# Patient Record
Sex: Female | Born: 1990 | Race: Black or African American | Hispanic: No | Marital: Single | Smoking: Never smoker
Health system: Southern US, Community
[De-identification: ages and names within clinical notes are randomized; demographics above are authoritative.]

## PROBLEM LIST (undated history)

## (undated) DIAGNOSIS — J45909 Unspecified asthma, uncomplicated: Secondary | ICD-10-CM

## (undated) DIAGNOSIS — N809 Endometriosis, unspecified: Secondary | ICD-10-CM

## (undated) HISTORY — PX: WISDOM TOOTH EXTRACTION: SHX21

## (undated) HISTORY — PX: OTHER SURGICAL HISTORY: SHX169

## (undated) HISTORY — PX: ABDOMINAL SURGERY: SHX537

---

## 2017-07-09 ENCOUNTER — Encounter (HOSPITAL_COMMUNITY): Admission: EM | Disposition: A | Discharge status: Home / Self Care | Payer: Self-pay | Source: Home / Self Care

## 2017-07-09 ENCOUNTER — Encounter (HOSPITAL_COMMUNITY): Payer: Self-pay | Admitting: Emergency Medicine

## 2017-07-09 ENCOUNTER — Other Ambulatory Visit: Payer: Self-pay

## 2017-07-09 ENCOUNTER — Observation Stay (HOSPITAL_COMMUNITY): Payer: BLUE CROSS/BLUE SHIELD | Admitting: Anesthesiology

## 2017-07-09 ENCOUNTER — Emergency Department (HOSPITAL_COMMUNITY): Payer: BLUE CROSS/BLUE SHIELD

## 2017-07-09 ENCOUNTER — Inpatient Hospital Stay (HOSPITAL_COMMUNITY)
Admission: EM | Admit: 2017-07-09 | Discharge: 2017-07-13 | DRG: 329 | Disposition: A | Discharge status: Home / Self Care | Payer: BLUE CROSS/BLUE SHIELD | Attending: General Surgery | Admitting: General Surgery

## 2017-07-09 DIAGNOSIS — K55029 Acute infarction of small intestine, extent unspecified: Secondary | ICD-10-CM | POA: Diagnosis present

## 2017-07-09 DIAGNOSIS — Q430 Meckel's diverticulum (displaced) (hypertrophic): Secondary | ICD-10-CM

## 2017-07-09 DIAGNOSIS — Z79899 Other long term (current) drug therapy: Secondary | ICD-10-CM

## 2017-07-09 DIAGNOSIS — K420 Umbilical hernia with obstruction, without gangrene: Principal | ICD-10-CM | POA: Diagnosis present

## 2017-07-09 DIAGNOSIS — Z885 Allergy status to narcotic agent status: Secondary | ICD-10-CM

## 2017-07-09 DIAGNOSIS — K567 Ileus, unspecified: Secondary | ICD-10-CM | POA: Diagnosis not present

## 2017-07-09 DIAGNOSIS — K436 Other and unspecified ventral hernia with obstruction, without gangrene: Secondary | ICD-10-CM | POA: Diagnosis present

## 2017-07-09 DIAGNOSIS — Z793 Long term (current) use of hormonal contraceptives: Secondary | ICD-10-CM

## 2017-07-09 HISTORY — PX: UMBILICAL HERNIA REPAIR: SHX196

## 2017-07-09 HISTORY — DX: Endometriosis, unspecified: N80.9

## 2017-07-09 LAB — COMPREHENSIVE METABOLIC PANEL
ALK PHOS: 82 U/L (ref 38–126)
ALT: 18 U/L (ref 14–54)
AST: 20 U/L (ref 15–41)
Albumin: 3.8 g/dL (ref 3.5–5.0)
Anion gap: 11 (ref 5–15)
BUN: 11 mg/dL (ref 6–20)
CALCIUM: 9 mg/dL (ref 8.9–10.3)
CO2: 20 mmol/L — ABNORMAL LOW (ref 22–32)
CREATININE: 0.79 mg/dL (ref 0.44–1.00)
Chloride: 109 mmol/L (ref 101–111)
Glucose, Bld: 112 mg/dL — ABNORMAL HIGH (ref 65–99)
Potassium: 3.2 mmol/L — ABNORMAL LOW (ref 3.5–5.1)
Sodium: 140 mmol/L (ref 135–145)
Total Bilirubin: 0.3 mg/dL (ref 0.3–1.2)
Total Protein: 7.8 g/dL (ref 6.5–8.1)

## 2017-07-09 LAB — URINALYSIS, ROUTINE W REFLEX MICROSCOPIC
Bilirubin Urine: NEGATIVE
Glucose, UA: NEGATIVE mg/dL
HGB URINE DIPSTICK: NEGATIVE
Ketones, ur: NEGATIVE mg/dL
LEUKOCYTES UA: NEGATIVE
Nitrite: NEGATIVE
PROTEIN: NEGATIVE mg/dL
Specific Gravity, Urine: 1.015 (ref 1.005–1.030)
pH: 8 (ref 5.0–8.0)

## 2017-07-09 LAB — CBC
HCT: 38.3 % (ref 36.0–46.0)
Hemoglobin: 12.6 g/dL (ref 12.0–15.0)
MCH: 27.3 pg (ref 26.0–34.0)
MCHC: 32.9 g/dL (ref 30.0–36.0)
MCV: 83.1 fL (ref 78.0–100.0)
PLATELETS: 283 10*3/uL (ref 150–400)
RBC: 4.61 MIL/uL (ref 3.87–5.11)
RDW: 13.3 % (ref 11.5–15.5)
WBC: 5.8 10*3/uL (ref 4.0–10.5)

## 2017-07-09 LAB — I-STAT BETA HCG BLOOD, ED (MC, WL, AP ONLY): I-stat hCG, quantitative: <5 m[IU]/mL (ref <5)

## 2017-07-09 LAB — LIPASE, BLOOD: LIPASE: 26 U/L (ref 11–51)

## 2017-07-09 SURGERY — REPAIR, HERNIA, UMBILICAL, ADULT
Anesthesia: General | Site: Abdomen

## 2017-07-09 MED ORDER — CEFOTETAN DISODIUM-DEXTROSE 2-2.08 GM-%(50ML) IV SOLR
2.0000 g | INTRAVENOUS | Status: AC
  Administered 2017-07-09: 2 g via INTRAVENOUS

## 2017-07-09 MED ORDER — BUPIVACAINE HCL (PF) 0.25 % IJ SOLN
INTRAMUSCULAR | Status: AC
  Filled 2017-07-09: qty 30

## 2017-07-09 MED ORDER — ROCURONIUM BROMIDE 10 MG/ML (PF) SYRINGE
PREFILLED_SYRINGE | INTRAVENOUS | Status: AC
  Filled 2017-07-09: qty 5

## 2017-07-09 MED ORDER — ONDANSETRON HCL 4 MG/2ML IJ SOLN
INTRAMUSCULAR | Status: DC | PRN
  Administered 2017-07-09: 4 mg via INTRAVENOUS

## 2017-07-09 MED ORDER — SIMETHICONE 80 MG PO CHEW
40.0000 mg | CHEWABLE_TABLET | Freq: Four times a day (QID) | ORAL | Status: DC | PRN
  Filled 2017-07-09: qty 1

## 2017-07-09 MED ORDER — MIDAZOLAM HCL 2 MG/2ML IJ SOLN
INTRAMUSCULAR | Status: AC
  Filled 2017-07-09: qty 2

## 2017-07-09 MED ORDER — DEXAMETHASONE SODIUM PHOSPHATE 10 MG/ML IJ SOLN
INTRAMUSCULAR | Status: AC
  Filled 2017-07-09: qty 1

## 2017-07-09 MED ORDER — METOPROLOL TARTRATE 5 MG/5ML IV SOLN: 5.0000 mg | Freq: Four times a day (QID) | INTRAVENOUS | Status: DC | PRN

## 2017-07-09 MED ORDER — ONDANSETRON 4 MG PO TBDP
4.0000 mg | ORAL_TABLET | Freq: Four times a day (QID) | ORAL | Status: DC | PRN
  Administered 2017-07-11: 4 mg via ORAL
  Filled 2017-07-09: qty 1

## 2017-07-09 MED ORDER — FENTANYL CITRATE (PF) 100 MCG/2ML IJ SOLN
25.0000 ug | INTRAMUSCULAR | Status: DC | PRN
  Administered 2017-07-09 (×2): 50 ug via INTRAVENOUS

## 2017-07-09 MED ORDER — HYDROMORPHONE HCL 1 MG/ML IJ SOLN
0.5000 mg | Freq: Once | INTRAMUSCULAR | Status: AC
  Administered 2017-07-09: 0.5 mg via INTRAVENOUS
  Filled 2017-07-09: qty 1

## 2017-07-09 MED ORDER — OXYCODONE HCL 5 MG PO TABS
5.0000 mg | ORAL_TABLET | ORAL | Status: DC | PRN
  Administered 2017-07-09 – 2017-07-10 (×6): 10 mg via ORAL
  Administered 2017-07-11 – 2017-07-12 (×3): 5 mg via ORAL
  Filled 2017-07-09 (×2): qty 1
  Filled 2017-07-09 (×4): qty 2
  Filled 2017-07-09: qty 1
  Filled 2017-07-09 (×2): qty 2

## 2017-07-09 MED ORDER — FENTANYL CITRATE (PF) 100 MCG/2ML IJ SOLN
INTRAMUSCULAR | Status: AC
  Filled 2017-07-09: qty 2

## 2017-07-09 MED ORDER — ROCURONIUM BROMIDE 10 MG/ML (PF) SYRINGE
PREFILLED_SYRINGE | INTRAVENOUS | Status: DC | PRN
  Administered 2017-07-09: 70 mg via INTRAVENOUS

## 2017-07-09 MED ORDER — SUGAMMADEX SODIUM 200 MG/2ML IV SOLN
INTRAVENOUS | Status: DC | PRN
  Administered 2017-07-09: 200 mg via INTRAVENOUS

## 2017-07-09 MED ORDER — MIDAZOLAM HCL 5 MG/5ML IJ SOLN
INTRAMUSCULAR | Status: DC | PRN
  Administered 2017-07-09: 1 mg via INTRAVENOUS

## 2017-07-09 MED ORDER — BUPIVACAINE HCL (PF) 0.25 % IJ SOLN
INTRAMUSCULAR | Status: DC | PRN
  Administered 2017-07-09: 20 mL

## 2017-07-09 MED ORDER — SUGAMMADEX SODIUM 200 MG/2ML IV SOLN
INTRAVENOUS | Status: AC
  Filled 2017-07-09: qty 2

## 2017-07-09 MED ORDER — PROPOFOL 10 MG/ML IV BOLUS
INTRAVENOUS | Status: DC | PRN
  Administered 2017-07-09: 200 mg via INTRAVENOUS

## 2017-07-09 MED ORDER — LIDOCAINE 2% (20 MG/ML) 5 ML SYRINGE
INTRAMUSCULAR | Status: DC | PRN
  Administered 2017-07-09: 100 mg via INTRAVENOUS

## 2017-07-09 MED ORDER — ONDANSETRON HCL 4 MG/2ML IJ SOLN: 4.0000 mg | Freq: Once | INTRAMUSCULAR | Status: DC | PRN

## 2017-07-09 MED ORDER — PANTOPRAZOLE SODIUM 40 MG IV SOLR
40.0000 mg | Freq: Every day | INTRAVENOUS | Status: DC
  Administered 2017-07-09 – 2017-07-12 (×4): 40 mg via INTRAVENOUS
  Filled 2017-07-09 (×4): qty 40

## 2017-07-09 MED ORDER — BUPIVACAINE LIPOSOME 1.3 % IJ SUSP
20.0000 mL | Freq: Once | INTRAMUSCULAR | Status: AC
  Administered 2017-07-09: 20 mL
  Filled 2017-07-09: qty 20

## 2017-07-09 MED ORDER — DEXAMETHASONE SODIUM PHOSPHATE 10 MG/ML IJ SOLN
INTRAMUSCULAR | Status: DC | PRN
  Administered 2017-07-09: 10 mg via INTRAVENOUS

## 2017-07-09 MED ORDER — ONDANSETRON HCL 4 MG/2ML IJ SOLN
4.0000 mg | Freq: Once | INTRAMUSCULAR | Status: AC
  Administered 2017-07-09: 4 mg via INTRAVENOUS
  Filled 2017-07-09: qty 2

## 2017-07-09 MED ORDER — LORAZEPAM 2 MG/ML IJ SOLN
0.5000 mg | Freq: Once | INTRAMUSCULAR | Status: AC
Start: 2017-07-09 — End: 2017-07-09
  Administered 2017-07-09: 0.5 mg via INTRAVENOUS
  Filled 2017-07-09: qty 1

## 2017-07-09 MED ORDER — DIPHENHYDRAMINE HCL 25 MG PO CAPS: 25.0000 mg | ORAL_CAPSULE | Freq: Four times a day (QID) | ORAL | Status: DC | PRN

## 2017-07-09 MED ORDER — FENTANYL CITRATE (PF) 250 MCG/5ML IJ SOLN
INTRAMUSCULAR | Status: AC
  Filled 2017-07-09: qty 5

## 2017-07-09 MED ORDER — ESMOLOL HCL 100 MG/10ML IV SOLN
INTRAVENOUS | Status: DC | PRN
Start: 2017-07-09 — End: 2017-07-09
  Administered 2017-07-09: 30 mg via INTRAVENOUS

## 2017-07-09 MED ORDER — HYDROMORPHONE HCL 1 MG/ML IJ SOLN
0.5000 mg | INTRAMUSCULAR | Status: DC | PRN
  Administered 2017-07-09 – 2017-07-11 (×2): 1 mg via INTRAVENOUS
  Filled 2017-07-09 (×2): qty 1

## 2017-07-09 MED ORDER — MORPHINE SULFATE (PF) 4 MG/ML IV SOLN
4.0000 mg | Freq: Once | INTRAVENOUS | Status: AC
  Administered 2017-07-09: 4 mg via INTRAVENOUS
  Filled 2017-07-09: qty 1

## 2017-07-09 MED ORDER — ESMOLOL HCL 100 MG/10ML IV SOLN
INTRAVENOUS | Status: AC
  Filled 2017-07-09: qty 10

## 2017-07-09 MED ORDER — 0.9 % SODIUM CHLORIDE (POUR BTL) OPTIME
TOPICAL | Status: DC | PRN
  Administered 2017-07-09: 1000 mL

## 2017-07-09 MED ORDER — LACTATED RINGERS IV SOLN
INTRAVENOUS | Status: DC | PRN
  Administered 2017-07-09 (×2): via INTRAVENOUS

## 2017-07-09 MED ORDER — IOPAMIDOL (ISOVUE-300) INJECTION 61%
100.0000 mL | Freq: Once | INTRAVENOUS | Status: AC | PRN
  Administered 2017-07-09: 100 mL via INTRAVENOUS

## 2017-07-09 MED ORDER — FENTANYL CITRATE (PF) 100 MCG/2ML IJ SOLN
INTRAMUSCULAR | Status: DC | PRN
  Administered 2017-07-09 (×4): 50 ug via INTRAVENOUS
  Administered 2017-07-09: 100 ug via INTRAVENOUS
  Administered 2017-07-09: 50 ug via INTRAVENOUS
  Administered 2017-07-09: 100 ug via INTRAVENOUS
  Administered 2017-07-09: 50 ug via INTRAVENOUS

## 2017-07-09 MED ORDER — ONDANSETRON HCL 4 MG/2ML IJ SOLN
INTRAMUSCULAR | Status: AC
  Filled 2017-07-09: qty 2

## 2017-07-09 MED ORDER — FENTANYL CITRATE (PF) 250 MCG/5ML IJ SOLN
INTRAMUSCULAR | Status: AC
Start: 2017-07-09 — End: 2017-07-09
  Filled 2017-07-09: qty 5

## 2017-07-09 MED ORDER — LIDOCAINE 2% (20 MG/ML) 5 ML SYRINGE
INTRAMUSCULAR | Status: AC
  Filled 2017-07-09: qty 5

## 2017-07-09 MED ORDER — HYDRALAZINE HCL 20 MG/ML IJ SOLN: 10.0000 mg | INTRAMUSCULAR | Status: DC | PRN

## 2017-07-09 MED ORDER — DIPHENHYDRAMINE HCL 50 MG/ML IJ SOLN: 25.0000 mg | Freq: Four times a day (QID) | INTRAMUSCULAR | Status: DC | PRN

## 2017-07-09 MED ORDER — CEFOTETAN DISODIUM-DEXTROSE 2-2.08 GM-%(50ML) IV SOLR
INTRAVENOUS | Status: AC
  Filled 2017-07-09: qty 50

## 2017-07-09 MED ORDER — ONDANSETRON HCL 4 MG/2ML IJ SOLN
4.0000 mg | Freq: Four times a day (QID) | INTRAMUSCULAR | Status: DC | PRN
  Administered 2017-07-09 – 2017-07-10 (×2): 4 mg via INTRAVENOUS
  Filled 2017-07-09 (×2): qty 2

## 2017-07-09 MED ORDER — METHOCARBAMOL 1000 MG/10ML IJ SOLN
500.0000 mg | Freq: Three times a day (TID) | INTRAVENOUS | Status: DC | PRN
  Administered 2017-07-09 – 2017-07-11 (×2): 500 mg via INTRAVENOUS
  Filled 2017-07-09 (×2): qty 550

## 2017-07-09 MED ORDER — ACETAMINOPHEN 325 MG PO TABS
650.0000 mg | ORAL_TABLET | Freq: Four times a day (QID) | ORAL | Status: DC | PRN
  Administered 2017-07-12 – 2017-07-13 (×2): 650 mg via ORAL
  Filled 2017-07-09 (×2): qty 2

## 2017-07-09 MED ORDER — ACETAMINOPHEN 650 MG RE SUPP: 650.0000 mg | Freq: Four times a day (QID) | RECTAL | Status: DC | PRN

## 2017-07-09 MED ORDER — IOPAMIDOL (ISOVUE-300) INJECTION 61%
INTRAVENOUS | Status: AC
  Filled 2017-07-09: qty 100

## 2017-07-09 MED ORDER — POTASSIUM CHLORIDE IN NACL 20-0.45 MEQ/L-% IV SOLN
INTRAVENOUS | Status: DC
  Administered 2017-07-09 – 2017-07-13 (×6): via INTRAVENOUS
  Filled 2017-07-09 (×6): qty 1000

## 2017-07-09 MED ORDER — PROPOFOL 10 MG/ML IV BOLUS
INTRAVENOUS | Status: AC
  Filled 2017-07-09: qty 20

## 2017-07-09 MED ORDER — PROMETHAZINE HCL 25 MG/ML IJ SOLN: 6.2500 mg | INTRAMUSCULAR | Status: DC | PRN

## 2017-07-09 MED ORDER — DOCUSATE SODIUM 100 MG PO CAPS
100.0000 mg | ORAL_CAPSULE | Freq: Two times a day (BID) | ORAL | Status: DC
  Administered 2017-07-10 – 2017-07-13 (×7): 100 mg via ORAL
  Filled 2017-07-09 (×7): qty 1

## 2017-07-09 SURGICAL SUPPLY — 53 items
BENZOIN TINCTURE PRP APPL 2/3 (GAUZE/BANDAGES/DRESSINGS) ×4 IMPLANT
BLADE HEX COATED 2.75 (ELECTRODE) ×4 IMPLANT
BLADE SURG 15 STRL LF DISP TIS (BLADE) ×2 IMPLANT
BLADE SURG 15 STRL SS (BLADE) ×2
CELLS DAT CNTRL 66122 CELL SVR (MISCELLANEOUS) ×2 IMPLANT
CLOSURE WOUND 1/2 X4 (GAUZE/BANDAGES/DRESSINGS) ×1
COVER SURGICAL LIGHT HANDLE (MISCELLANEOUS) ×4 IMPLANT
DECANTER SPIKE VIAL GLASS SM (MISCELLANEOUS) ×4 IMPLANT
DERMABOND ADVANCED (GAUZE/BANDAGES/DRESSINGS)
DERMABOND ADVANCED .7 DNX12 (GAUZE/BANDAGES/DRESSINGS) IMPLANT
DRAIN PENROSE 18X1/2 LTX STRL (DRAIN) ×4 IMPLANT
DRAPE LAPAROSCOPIC ABDOMINAL (DRAPES) ×4 IMPLANT
DRSG OPSITE POSTOP 4X6 (GAUZE/BANDAGES/DRESSINGS) ×4 IMPLANT
ELECT PENCIL ROCKER SW 15FT (MISCELLANEOUS) ×4 IMPLANT
ELECT REM PT RETURN 15FT ADLT (MISCELLANEOUS) ×4 IMPLANT
GAUZE SPONGE 4X4 12PLY STRL (GAUZE/BANDAGES/DRESSINGS) ×4 IMPLANT
GLOVE BIO SURGEON STRL SZ 6 (GLOVE) ×4 IMPLANT
GLOVE BIO SURGEON STRL SZ7 (GLOVE) ×8 IMPLANT
GLOVE BIOGEL PI IND STRL 6.5 (GLOVE) ×2 IMPLANT
GLOVE BIOGEL PI IND STRL 7.0 (GLOVE) ×2 IMPLANT
GLOVE BIOGEL PI INDICATOR 6.5 (GLOVE) ×2
GLOVE BIOGEL PI INDICATOR 7.0 (GLOVE) ×2
GLOVE INDICATOR 6.5 STRL GRN (GLOVE) ×4 IMPLANT
GOWN STRL REUS W/ TWL XL LVL3 (GOWN DISPOSABLE) ×6 IMPLANT
GOWN STRL REUS W/TWL 2XL LVL3 (GOWN DISPOSABLE) ×4 IMPLANT
GOWN STRL REUS W/TWL XL LVL3 (GOWN DISPOSABLE) ×6
HANDLE SUCTION POOLE (INSTRUMENTS) ×2 IMPLANT
KIT BASIN OR (CUSTOM PROCEDURE TRAY) ×4 IMPLANT
LIGASURE IMPACT 36 18CM CVD LR (INSTRUMENTS) ×4 IMPLANT
NEEDLE HYPO 25X1 1.5 SAFETY (NEEDLE) ×4 IMPLANT
PACK BASIC VI WITH GOWN DISP (CUSTOM PROCEDURE TRAY) ×4 IMPLANT
RELOAD PROXIMATE 75MM BLUE (ENDOMECHANICALS) ×12 IMPLANT
RTRCTR WOUND ALEXIS 18CM MED (MISCELLANEOUS) ×4
SPONGE LAP 18X18 X RAY DECT (DISPOSABLE) ×8 IMPLANT
STAPLER GUN LINEAR PROX 60 (STAPLE) ×4 IMPLANT
STAPLER VISISTAT 35W (STAPLE) ×4 IMPLANT
STRIP CLOSURE SKIN 1/2X4 (GAUZE/BANDAGES/DRESSINGS) ×3 IMPLANT
SUCTION POOLE HANDLE (INSTRUMENTS) ×4
SUT MNCRL AB 4-0 PS2 18 (SUTURE) ×4 IMPLANT
SUT PDS AB 1 TP1 96 (SUTURE) ×8 IMPLANT
SUT PROLENE 2 0 SH DA (SUTURE) ×16 IMPLANT
SUT SILK 2 0 SH (SUTURE) IMPLANT
SUT SILK 3 0 (SUTURE)
SUT SILK 3-0 18XBRD TIE 12 (SUTURE) IMPLANT
SUT VIC AB 2-0 SH 27 (SUTURE) ×6
SUT VIC AB 2-0 SH 27X BRD (SUTURE) ×6 IMPLANT
SUT VIC AB 3-0 SH 18 (SUTURE) ×4 IMPLANT
SUT VIC AB 3-0 SH 27 (SUTURE) ×4
SUT VIC AB 3-0 SH 27XBRD (SUTURE) ×4 IMPLANT
SYR BULB IRRIGATION 50ML (SYRINGE) ×4 IMPLANT
SYR CONTROL 10ML LL (SYRINGE) ×4 IMPLANT
TOWEL OR 17X26 10 PK STRL BLUE (TOWEL DISPOSABLE) ×4 IMPLANT
YANKAUER SUCT BULB TIP 10FT TU (MISCELLANEOUS) ×4 IMPLANT

## 2017-07-09 NOTE — ED Notes (Signed)
Bed: [id number] Expected date:  Expected time:  Means of arrival:  Comments: EMS-umbilical hernia

## 2017-07-09 NOTE — Progress Notes (Signed)
Patient is transferred from surgery at 1825. Alert and  Oriented x4, vital signs was taken. Dressing is intact and small drainage. Will monitor patient as protocol.

## 2017-07-09 NOTE — H&P (Signed)
[facility] Admission Note  Kahliyah Dick 03-Apr-1991  [id number].    Requesting MD: Quintella Reichert Chief Complaint/Reason for Consult: umbilical hernia   HPI:  Leslie Stein is a 27yo female who presented to the [facility] earlier today with acute onset umbilical pain. States that she had an umbilical cyst removed in the OR in [place] around 2014 as well as a laparoscopic ovarian cyst removal, and has had an umbilical hernia present for a few years. States that it will pop in/out about twice weekly but this is the first time she was unable to reduce it. States that she vomited once this morning due to the pain. She is passing flatus. Pain became constant and severe so she came to the ED. WBC 5.8, afebrile. CT scan shows umbilical abdominal wall hernia containing a small bowel loop with the small bowel immediately deep to the umbilical hernia site, it is mildly distended and has markedly thickened walls with surrounding mesenteric inflammation, highly suspicious for ischemic bowel. Concern for incarceration and/or closed-loop obstruction at the hernia site. General surgery asked to see.  No significant PMH Anticoagulants: none Nonsmoker Employment: HR  ROS: Review of Systems  Constitutional: Negative.   HENT: Negative.   Eyes: Negative.   Respiratory: Negative.   Cardiovascular: Negative.   Gastrointestinal: Positive for abdominal pain, nausea and vomiting. Negative for constipation and diarrhea.  Genitourinary: Negative.   Musculoskeletal: Negative.   Skin: Negative.   Neurological: Negative.    All systems reviewed and otherwise negative except for as above  No family history on file.  Past Medical History:  Diagnosis Date  . Endometriosis     Past Surgical History:  Procedure Laterality Date  . ABDOMINAL SURGERY    . cyst on umbilicus      Social History:  does not have a smoking history on file. she has never used smokeless tobacco. She reports that  she drinks alcohol. She reports that she does not use drugs.  Allergies:  Allergies  Allergen Reactions  . Percocet [Oxycodone-Acetaminophen] Nausea And Vomiting     (Not in a hospital admission)  Prior to Admission medications   Not on File    Blood pressure (!) 161/90, pulse 78, temperature 97.6 F (36.4 C), temperature source Oral, resp. rate 19, height 5' 7"  (1.702 m), weight 240 lb (108.9 kg), last menstrual period 06/25/2017, SpO2 100 %. Physical Exam: General: pleasant, WD/WN AA female who is laying in bed in NAD HEENT: head is normocephalic, atraumatic.  Sclera are noninjected.  Pupils equal and round.  Ears and nose without any masses or lesions.  Mouth is pink and moist. Dentition fair Heart: regular, rate, and rhythm.  No obvious murmurs, gallops, or rubs noted.  Palpable pedal pulses bilaterally Lungs: CTAB, no wheezes, rhonchi, or rales noted.  Respiratory effort nonlabored Abd: soft, ND, +BS. no organomegaly. Incarcerated umbilical hernia TTP without overlying skin changes, not reducible. No other abdominal TTP MS: all 4 extremities are symmetrical with no cyanosis, clubbing, or edema. Skin: warm and dry with no masses, lesions, or rashes Psych: A&Ox3 with an appropriate affect. Neuro: cranial nerves grossly intact, extremity CSM intact bilaterally, normal speech  Results for orders placed or performed during the hospital encounter of 07/09/17 (from the past 48 hour(s))  I-Stat beta hCG blood, ED     Status: None   Collection Time: 07/09/17  9:24 AM  Result Value Ref Range   I-stat hCG, quantitative <5.0 <5 mIU/mL   Comment 3  Comment:   GEST. AGE      CONC.  (mIU/mL)   <=1 WEEK        5 - 50     2 WEEKS       50 - 500     3 WEEKS       100 - 10,000     4 WEEKS     1,000 - 30,000        FEMALE AND NON-PREGNANT FEMALE:     LESS THAN 5 mIU/mL   Lipase, blood     Status: None   Collection Time: 07/09/17  9:26 AM  Result Value Ref Range   Lipase 26 11 -  51 U/L    Comment: Performed at [facility], [place] [address], [place], [place] [address]  Comprehensive metabolic panel     Status: Abnormal   Collection Time: 07/09/17  9:26 AM  Result Value Ref Range   Sodium 140 135 - 145 mmol/L   Potassium 3.2 (L) 3.5 - 5.1 mmol/L   Chloride 109 101 - 111 mmol/L   CO2 20 (L) 22 - 32 mmol/L   Glucose, Bld 112 (H) 65 - 99 mg/dL   BUN 11 6 - 20 mg/dL   Creatinine, Ser 0.79 0.44 - 1.00 mg/dL   Calcium 9.0 8.9 - 10.3 mg/dL   Total Protein 7.8 6.5 - 8.1 g/dL   Albumin 3.8 3.5 - 5.0 g/dL   AST 20 15 - 41 U/L   ALT 18 14 - 54 U/L   Alkaline Phosphatase 82 38 - 126 U/L   Total Bilirubin 0.3 0.3 - 1.2 mg/dL   GFR calc non Af Amer >60 >60 mL/min   GFR calc Af Amer >60 >60 mL/min    Comment: (NOTE) The eGFR has been calculated using the CKD EPI equation. This calculation has not been validated in all clinical situations. eGFR's persistently <60 mL/min signify possible Chronic Kidney Disease.    Anion gap 11 5 - 15    Comment: Performed at [facility], [place] [address], [place], [place] [address]  CBC     Status: None   Collection Time: 07/09/17  9:26 AM  Result Value Ref Range   WBC 5.8 4.0 - 10.5 K/uL   RBC 4.61 3.87 - 5.11 MIL/uL   Hemoglobin 12.6 12.0 - 15.0 g/dL   HCT 38.3 36.0 - 46.0 %   MCV 83.1 78.0 - 100.0 fL   MCH 27.3 26.0 - 34.0 pg   MCHC 32.9 30.0 - 36.0 g/dL   RDW 13.3 11.5 - 15.5 %   Platelets 283 150 - 400 K/uL    Comment: Performed at [facility], [place] [address], [place], [place] [address]  Urinalysis, Routine w reflex microscopic     Status: Abnormal   Collection Time: 07/09/17  9:26 AM  Result Value Ref Range   Color, Urine YELLOW YELLOW   APPearance CLOUDY (A) CLEAR   Specific Gravity, Urine 1.015 1.005 - 1.030   pH 8.0 5.0 - 8.0   Glucose, UA NEGATIVE NEGATIVE mg/dL   Hgb urine dipstick NEGATIVE NEGATIVE   Bilirubin Urine NEGATIVE NEGATIVE   Ketones, ur NEGATIVE  NEGATIVE mg/dL   Protein, ur NEGATIVE NEGATIVE mg/dL   Nitrite NEGATIVE NEGATIVE   Leukocytes, UA NEGATIVE NEGATIVE    Comment: Performed at [place] [address], [place], [place] [address]   No results found.    Assessment/Plan Incarcerated umbilical hernia - Concern  for ischemic bowel on CT scan. Admit to med-surg. Keep patient NPO and continue IVF. Will take her to the operating room urgently today for umbilical hernia repair and possible bowel resection.  ID - cefotetan on call to OR VTE - SCDs FEN - IVF, NPO Foley - none Follow up - TBD  Wellington Hampshire, [facility] 07/09/2017, 11:25 AM Pager: [phone] Consults: [phone] Mon-Fri 7:00 am-4:30 pm Sat-Sun 7:00 am-11:30 am

## 2017-07-09 NOTE — Anesthesia Procedure Notes (Signed)
Procedure Name: Intubation Date/Time: 07/09/2017 3:18 PM Performed by: Raenette Rover, CRNA Pre-anesthesia Checklist: Patient identified, Emergency Drugs available, Suction available and Patient being monitored Patient Re-evaluated:Patient Re-evaluated prior to induction Oxygen Delivery Method: Circle system utilized Preoxygenation: Pre-oxygenation with 100% oxygen Induction Type: IV induction and Rapid sequence Laryngoscope Size: Mac and 3 Grade View: Grade I Tube type: Oral Tube size: 7.0 mm Number of attempts: 1 Airway Equipment and Method: Stylet Placement Confirmation: ETT inserted through vocal cords under direct vision,  positive ETCO2,  CO2 detector and breath sounds checked- equal and bilateral Secured at: 21 cm Tube secured with: Tape Dental Injury: Teeth and Oropharynx as per pre-operative assessment

## 2017-07-09 NOTE — ED Notes (Signed)
Had to straight stick for labs related to IV not allowing sufficient amount of blood for blood draw.

## 2017-07-09 NOTE — ED Provider Notes (Signed)
[facility] Provider Note   CSN: [id number] Arrival date & time: 07/09/17  0906     History   Chief Complaint Chief Complaint  Patient presents with  . Abdominal Pain  . Emesis    HPI Leslie Stein is a 27 y.o. female with history of umbilical cyst and abdominal surgery abdominal hernia who presents with acute onset umbilical pain with associated emesis that began around 7 AM this morning.  Patient reports history of umbilical cyst and surgery that was conducted 4-5 years ago in [place].  Since then, she has had umbilical hernia pop in and out a couple times a week, however it is never stayed out in or caused her pain.  She denies any abnormal bowel movements and had a normal one this morning.  She denies any fevers, chest pain, shortness of breath, urinary symptoms, abnormal vaginal discharge or bleeding.  LMP 06/25/2017.  She did not take any medications prior to arrival.  HPI  Past Medical History:  Diagnosis Date  . Endometriosis     Patient Active Problem List   Diagnosis Date Noted  . Incarcerated umbilical hernia 07/09/2017    Past Surgical History:  Procedure Laterality Date  . ABDOMINAL SURGERY     ovarian cyst-right? pt. unsure  . cyst on umbilicus      OB History    No data available       Home Medications    Prior to Admission medications   Medication Sig Start Date End Date Taking? Authorizing Provider  loratadine (CLARITIN) 10 MG tablet Take 10 mg by mouth daily.   Yes [provider]  norethindrone-ethinyl estradiol-iron (ESTROSTEP FE,TILIA FE,TRI-LEGEST FE) 1-20/1-30/1-35 MG-MCG tablet Take 1 tablet by mouth daily. Birth control   Yes [provider]  valACYclovir (VALTREX) 500 MG tablet Take 500 mg by mouth 2 (two) times daily.   Yes [provider]    Family History History reviewed. No pertinent family history.  Social History Social History   Tobacco Use  . Smoking status: Never Smoker  .  Smokeless tobacco: Never Used  Substance Use Topics  . Alcohol use: Yes  . Drug use: No     Allergies   Percocet [oxycodone-acetaminophen]   Review of Systems Review of Systems  Constitutional: Negative for chills and fever.  HENT: Negative for facial swelling and sore throat.   Respiratory: Negative for shortness of breath.   Cardiovascular: Negative for chest pain.  Gastrointestinal: Positive for abdominal pain, nausea and vomiting.  Genitourinary: Negative for dysuria.  Musculoskeletal: Negative for back pain.  Skin: Negative for rash and wound.  Neurological: Negative for headaches.  Psychiatric/Behavioral: The patient is not nervous/anxious.      Physical Exam Updated Vital Signs BP (!) 137/98 (BP Location: Right Arm)   Pulse 86   Temp (!) 97.4 F (36.3 C)   Resp 20   Ht 5\' 7"  (1.702 m)   Wt 108.9 kg (240 lb)   LMP 06/25/2017 Comment: neg hcg  SpO2 100%   BMI 37.59 kg/m   Physical Exam  Constitutional: She appears well-developed and well-nourished. No distress.  HENT:  Head: Normocephalic and atraumatic.  Mouth/Throat: Oropharynx is clear and moist. No oropharyngeal exudate.  Eyes: Conjunctivae are normal. Pupils are equal, round, and reactive to light. Right eye exhibits no discharge. Left eye exhibits no discharge. No scleral icterus.  Neck: Normal range of motion. Neck supple. No thyromegaly present.  Cardiovascular: Normal rate, regular rhythm, normal heart sounds and intact  distal pulses. Exam reveals no gallop and no friction rub.  No murmur heard. Pulmonary/Chest: Effort normal and breath sounds normal. No stridor. No respiratory distress. She has no wheezes. She has no rales.  Abdominal: Soft. Bowel sounds are normal. She exhibits no distension. There is tenderness (over umbilicus only) in the periumbilical area. There is no rebound and no guarding.    Musculoskeletal: She exhibits no edema.  Lymphadenopathy:    She has no cervical adenopathy.    Neurological: She is alert. Coordination normal.  Skin: Skin is warm and dry. No rash noted. She is not diaphoretic. No pallor.  Psychiatric: She has a normal mood and affect.  Nursing note and vitals reviewed.    ED Treatments / Results  Labs (all labs ordered are listed, but only abnormal results are displayed) Labs Reviewed  COMPREHENSIVE METABOLIC PANEL - Abnormal; Notable for the following components:      Result Value   Potassium 3.2 (*)    CO2 20 (*)    Glucose, Bld 112 (*)    All other components within normal limits  URINALYSIS, ROUTINE W REFLEX MICROSCOPIC - Abnormal; Notable for the following components:   APPearance CLOUDY (*)    All other components within normal limits  LIPASE, BLOOD  CBC  HIV ANTIBODY (ROUTINE TESTING)  BASIC METABOLIC PANEL  CBC  I-STAT BETA HCG BLOOD, ED ([facility], [facility], [facility] ONLY)  SURGICAL PATHOLOGY    EKG  EKG Interpretation None       Radiology Ct Abdomen Pelvis W Contrast  Addendum Date: 07/09/2017   ADDENDUM REPORT: 07/09/2017 16:56 ADDENDUM: After further review of the images with Dr. 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 As such, a majority of the small bowel is located to the right of midline and a majority of the colon is in the left abdomen. Electronically Signed   By: Bary Richard M.D.   On: 07/09/2017 16:56   Result Date: 07/09/2017 CLINICAL DATA:  Periumbilical abdominal pain. History of umbilical cyst removal 3 years ago. EXAM: CT ABDOMEN AND PELVIS WITH CONTRAST TECHNIQUE: Multidetector CT imaging of the abdomen and pelvis was performed using the standard protocol following bolus administration of intravenous contrast. CONTRAST:  ISOVUE-300 IOPAMIDOL (ISOVUE-300) INJECTION 61% COMPARISON:  None. FINDINGS: Lower chest: No acute abnormality. Hepatobiliary: Ill-defined, somewhat curvilinear, hypodense focus is seen within the inferior right hepatic lobe, of uncertain etiology or significance, possibly focal intrahepatic bile  duct ectasia. Liver otherwise unremarkable. Gallbladder appears normal. Pancreas: Unremarkable. No pancreatic ductal dilatation or surrounding inflammatory changes. Spleen: Normal in size without focal abnormality. Adrenals/Urinary Tract: Adrenal glands are unremarkable. Kidneys are normal, without renal calculi, focal lesion, or hydronephrosis. Bladder is unremarkable. Stomach/Bowel: Periumbilical abdominal wall hernia containing a small bowel loop. There is tethering of the mesenteric vessels at the umbilical hernia site. The small bowel immediately deep to the umbilical hernia site is mildly distended and has markedly thickened walls and there is surrounding mesenteric inflammation, highly suspicious for ischemic bowel, possibly associated incarceration. The more proximal small bowel is not distended. Large bowel is normal in caliber and configuration. Vascular/Lymphatic: No significant vascular findings are present. No enlarged abdominal or pelvic lymph nodes. Reproductive: Multiple uterine fibroids, both myometrial and exophytic, largest measuring 4-5 cm. No mass or fluid within either adnexal region. Other: No abscess collection.  No free intraperitoneal air. Musculoskeletal: No acute or significant osseous findings. IMPRESSION: 1. Umbilical abdominal wall hernia which contains a small bowel loop. The small bowel immediately deep to the  umbilical hernia site is mildly distended and has markedly thickened walls with surrounding mesenteric inflammation, highly suspicious for ischemic bowel. There appears to be tethering of the adjacent mesenteric vessels and there may be some component of incarceration and/or closed-loop obstruction at the hernia site. Immediate surgical consultation recommended. 2. Small amount of free fluid in the right lower quadrant. 3. Subtle cover linear hypodense focus within the inferior right hepatic lobe, of uncertain etiology or significance. Differential considerations include focal  intrahepatic bile duct dilatation/ectasia and hemangioma. Recommend nonemergent follow-up with liver MRI. 4. Leiomyomatous uterus. These results (in regards to the bowel) were called by telephone at the time of interpretation on 07/09/2017 at 12:49 pm to Dr. Carlena Bjornstad , who verbally acknowledged these results. Electronically Signed: By: Bary Richard M.D. On: 07/09/2017 12:49    Procedures Procedures (including critical care time)  Medications Ordered in ED Medications  iopamidol (ISOVUE-300) 61 % injection (not administered)  0.45 % NaCl with KCl 20 mEq / L infusion (not administered)  metoprolol tartrate (LOPRESSOR) injection 5 mg (not administered)  hydrALAZINE (APRESOLINE) injection 10 mg (not administered)  pantoprazole (PROTONIX) injection 40 mg (not administered)  simethicone (MYLICON) chewable tablet 40 mg (not administered)  ondansetron (ZOFRAN-ODT) disintegrating tablet 4 mg (not administered)    Or  ondansetron (ZOFRAN) injection 4 mg (not administered)  docusate sodium (COLACE) capsule 100 mg (not administered)  diphenhydrAMINE (BENADRYL) capsule 25 mg (not administered)    Or  diphenhydrAMINE (BENADRYL) injection 25 mg (not administered)  HYDROmorphone (DILAUDID) injection 0.5-1 mg (1 mg Intravenous Given 07/09/17 1348)  acetaminophen (TYLENOL) tablet 650 mg (not administered)    Or  acetaminophen (TYLENOL) suppository 650 mg (not administered)  oxyCODONE (Oxy IR/ROXICODONE) immediate release tablet 5-10 mg (not administered)  methocarbamol (ROBAXIN) 500 mg in dextrose 5 % 50 mL IVPB (not administered)  fentaNYL (SUBLIMAZE) injection 25-50 mcg (not administered)  promethazine (PHENERGAN) injection 6.25-12.5 mg (not administered)  morphine 4 MG/ML injection 4 mg (4 mg Intravenous Given 07/09/17 0941)  ondansetron (ZOFRAN) injection 4 mg (4 mg Intravenous Given 07/09/17 0941)  HYDROmorphone (DILAUDID) injection 0.5 mg (0.5 mg Intravenous Given 07/09/17 1010)  HYDROmorphone  (DILAUDID) injection 0.5 mg (0.5 mg Intravenous Given 07/09/17 1034)  LORazepam (ATIVAN) injection 0.5 mg (0.5 mg Intravenous Given 07/09/17 1034)  iopamidol (ISOVUE-300) 61 % injection 100 mL (100 mLs Intravenous Contrast Given 07/09/17 1223)  cefoTEtan in Dextrose 5% (CEFOTAN) IVPB 2 g (2 g Intravenous Given 07/09/17 1522)  cefoTEtan in Dextrose 5% (CEFOTAN) 2-2.08 GM-%(50ML) IVPB (  Override pull for Anesthesia 07/09/17 1522)  bupivacaine liposome (EXPAREL) 1.3 % injection 266 mg (20 mLs Infiltration Given 07/09/17 1624)     Initial Impression / Assessment and Plan / ED Course  I have reviewed the triage vital signs and the nursing notes.  Pertinent labs & imaging results that were available during my care of the patient were reviewed by me and considered in my medical decision making (see chart for details).     Patient with incarcerated umbilical hernia.  After Dilaudid and Ativan given in the ED, unable to reduce.  We consulted general surgery who attempted and was unable to reduce as well.  CT abdomen pelvis was ordered by surgery which showed [Umbilical abdominal wall hernia which contains a small bowel loop. The small bowel immediately deep to the umbilical hernia site is mildly distended and has markedly thickened walls with surrounding mesenteric inflammation, highly suspicious for ischemic bowel. There appears to be tethering of the adjacent  mesenteric vessels and there may be some component of incarceration and/or closed-loop obstruction at the hernia site.]  They plan to take the patient directly to the OR.  Patient understands and agrees with plan.  Patient also evaluated by Dr. Madilyn Hook who guided the patient's management as well as attempted in reduction.  Patient vitals stable throughout ED course and transferred to the surgical service in stable condition.  Final Clinical Impressions(s) / ED Diagnoses   Final diagnoses:  Incarcerated umbilical hernia    ED Discharge Orders    None        Emi Holes, PA-C 07/09/17 1706    Tilden Fossa, MD 07/14/17 1244

## 2017-07-09 NOTE — ED Notes (Signed)
Surgery at bedside.

## 2017-07-09 NOTE — Anesthesia Preprocedure Evaluation (Signed)
Anesthesia Evaluation  Patient identified by MRN, date of birth, ID band Patient awake    Reviewed: Allergy & Precautions, NPO status , Patient's Chart, lab work & pertinent test results  Airway Mallampati: II  TM Distance: >3 FB Neck ROM: Full    Dental  (+) Dental Advisory Given   Pulmonary neg pulmonary ROS,    Pulmonary exam normal breath sounds clear to auscultation       Cardiovascular negative cardio ROS Normal cardiovascular exam Rhythm:Regular Rate:Normal     Neuro/Psych negative neurological ROS  negative psych ROS   GI/Hepatic Neg liver ROS, Incarcerated umbilical hernia   Endo/Other  Obesity  Renal/GU negative Renal ROS  negative genitourinary   Musculoskeletal negative musculoskeletal ROS (+)   Abdominal (+) + obese,   Peds  Hematology negative hematology ROS (+)   Anesthesia Other Findings   Reproductive/Obstetrics Endometriosis                             Anesthesia Physical Anesthesia Plan  ASA: II and emergent  Anesthesia Plan: General   Post-op Pain Management:    Induction: Intravenous and Rapid sequence  PONV Risk Score and Plan: 4 or greater and Treatment may vary due to age or medical condition, Ondansetron, Dexamethasone, Midazolam and Scopolamine patch - Pre-op  Airway Management Planned: Oral ETT  Additional Equipment: None  Intra-op Plan:   Post-operative Plan: Extubation in OR  Informed Consent: I have reviewed the patients History and Physical, chart, labs and discussed the procedure including the risks, benefits and alternatives for the proposed anesthesia with the patient or authorized representative who has indicated his/her understanding and acceptance.   Dental advisory given  Plan Discussed with: CRNA  Anesthesia Plan Comments:         Anesthesia Quick Evaluation

## 2017-07-09 NOTE — ED Triage Notes (Signed)
Per EMS-abdominal pain that started at 0700-states she had an umbilical cyst which was removed 3 years ago--states some distention around her umbilicas-vomiting started once she arrived to ED

## 2017-07-09 NOTE — Anesthesia Postprocedure Evaluation (Signed)
Anesthesia Post Note  Patient: Leslie Stein  Procedure(s) Performed: SMALL BOWEL RESECTION AND REPAIR OF STRANGULATED VENTRAL HERNIA WITH REMOVAL OF MECKEL DIVERTICULUM (N/A Abdomen)     Patient location during evaluation: PACU Anesthesia Type: General Level of consciousness: awake and alert Pain management: pain level controlled Vital Signs Assessment: post-procedure vital signs reviewed and stable Respiratory status: spontaneous breathing, nonlabored ventilation and respiratory function stable Cardiovascular status: blood pressure returned to baseline and stable Postop Assessment: no apparent nausea or vomiting Anesthetic complications: no    Last Vitals:  Vitals:   07/09/17 1700 07/09/17 1715  BP: (!) 137/98 (!) 139/98  Pulse: 86 93  Resp: 20 16  Temp:    SpO2: 100% 100%    Last Pain:  Vitals:   07/09/17 1720  TempSrc:   PainSc: 5                  Beryle Lathehomas E Brock

## 2017-07-09 NOTE — Transfer of Care (Signed)
Immediate Anesthesia Transfer of Care Note  Patient: Golda AcreSaeeda Chaffin  Procedure(s) Performed: SMALL BOWEL RESECTION AND REPAIR OF STRANGULATED VENTRAL HERNIA WITH REMOVAL OF MECKEL DIVERTICULUM (N/A Abdomen)  Patient Location: PACU  Anesthesia Type:General  Level of Consciousness: awake, alert , oriented and patient cooperative  Airway & Oxygen Therapy: Patient Spontanous Breathing and Patient connected to nasal cannula oxygen  Post-op Assessment: Report given to RN and Post -op Vital signs reviewed and stable  Post vital signs: Reviewed and stable  Last Vitals:  Vitals:   07/09/17 1342 07/09/17 1648  BP: (!) 166/98 (!) 147/91  Pulse: 83 94  Resp: 17 20  Temp:  (!) (P) 36.3 C  SpO2: 100% 100%    Last Pain:  Vitals:   07/09/17 1437  TempSrc:   PainSc: 2          Complications: No apparent anesthesia complications

## 2017-07-09 NOTE — Op Note (Signed)
PRE-OPERATIVE DIAGNOSIS: strangulated ventral umbilical hernia  POST-OPERATIVE DIAGNOSIS:  Same + meckel's diverticulum  PROCEDURE:  Procedure(s): Open repair of strangulated ventral umbilical hernia and small bowel resection including meckel's diverticulum  SURGEON:  Surgeon(s): Almond LintFaera Tatijana Bierly, MD  ASSISTANT:  Wells GuilesKelly Rayburn, PA-C  ANESTHESIA:   general and exparel  DRAINS: none   LOCAL MEDICATIONS USED:  OTHER exparel mixed with 0.25% marcaine  SPECIMEN:  Source of Specimen:  necrotic small bowel wtih adjacent meckel's diverticulum  DISPOSITION OF SPECIMEN:  PATHOLOGY  COUNTS:  YES  DICTATION: .Dragon Dictation  PLAN OF CARE: Admit to inpatient   PATIENT DISPOSITION:  PACU - hemodynamically stable.  FINDINGS:  Incarcerated small bowel with full thickness necrosis. Meckel's diverticulum was around 3 cm distal to necrotic segment, so it was resected en bloc Also, right colon was located in the left abdomen suspicious for malrotation.  No   EBL: min  PROCEDURE:  Pt was identified in the holding area and there was taken to the OR where she was placed supine on the operating table.  General anesthesia was induced.  A Foley catheter was placed.  Her abdomen was prepped and draped in sterile fashion.  Timeout was performed according to the surgical safety checklist.  When all was correct, we continued.  A vertical midline incision was made detouring around the umbilicus on the left.  The subcu tissues were divided with the cautery.  The fascia was entered sharply in the midline below the hernia sac as it was apparent that there was a loop of small bowel stuck in there and it appeared necrotic.  The hernia sac was opened and the small bowel was reduced.  The small bowel and the hernia sac was dusky but there was a loop below the hernia that was frankly necrotic.  This was identified and resected with a GIA-75 stapler.  The bowel was divided on the distal end, and then it became apparent  that there was a Meckel's diverticulum around 3-4 cm distal to the necrotic area.  This was incorporated into the resection.  The LigaSure was used to divide the small bowel mesentery.  A side-to-side, functional end-to-end anastomosis was created with the GIA 75 mm stapler and the TX 60 stapler.  The mesenteric defect was closed with a running 2-0 Vicryl suture.  An apex stitch was placed at the crotch of the anastomosis to decrease tension.  The abdomen was then irrigated.    When I went to pull the omentum back down, it was apparent that the right colon was in the left abdomen.  This was concerning for malrotation.  I placed all the small bowel in the right side of the abdomen.  The colon was not adherent with any Ladd's bands.  The fascia was then closed with running 1 looped PDS suture.  The skin was irrigated.  A portion of the hernia sac was resected with cautery.  The umbilicus was tacked down to the fascial incision with a 3-0 Vicryl.  The skin was then reapproximated with staples. The spleen, dried, and dressed with a soft sterile dressing.  The patient was allowed to emerge from anesthesia and was taken to the PACU in stable condition.  Needle, sponge, and instrument counts were correct x2.

## 2017-07-09 NOTE — ED Notes (Signed)
Patient transported to CT 

## 2017-07-10 ENCOUNTER — Encounter (HOSPITAL_COMMUNITY): Payer: Self-pay | Admitting: General Surgery

## 2017-07-10 DIAGNOSIS — K436 Other and unspecified ventral hernia with obstruction, without gangrene: Secondary | ICD-10-CM | POA: Diagnosis present

## 2017-07-10 DIAGNOSIS — Z793 Long term (current) use of hormonal contraceptives: Secondary | ICD-10-CM | POA: Diagnosis not present

## 2017-07-10 DIAGNOSIS — Z79899 Other long term (current) drug therapy: Secondary | ICD-10-CM | POA: Diagnosis not present

## 2017-07-10 DIAGNOSIS — K420 Umbilical hernia with obstruction, without gangrene: Secondary | ICD-10-CM | POA: Diagnosis present

## 2017-07-10 DIAGNOSIS — Z885 Allergy status to narcotic agent status: Secondary | ICD-10-CM | POA: Diagnosis not present

## 2017-07-10 DIAGNOSIS — K567 Ileus, unspecified: Secondary | ICD-10-CM | POA: Diagnosis not present

## 2017-07-10 DIAGNOSIS — K55029 Acute infarction of small intestine, extent unspecified: Secondary | ICD-10-CM | POA: Diagnosis present

## 2017-07-10 DIAGNOSIS — Q430 Meckel's diverticulum (displaced) (hypertrophic): Secondary | ICD-10-CM | POA: Diagnosis not present

## 2017-07-10 LAB — BASIC METABOLIC PANEL
ANION GAP: 7 (ref 5–15)
BUN: 7 mg/dL (ref 6–20)
CHLORIDE: 108 mmol/L (ref 101–111)
CO2: 22 mmol/L (ref 22–32)
Calcium: 8.4 mg/dL — ABNORMAL LOW (ref 8.9–10.3)
Creatinine, Ser: 0.78 mg/dL (ref 0.44–1.00)
GFR calc Af Amer: >60 mL/min (ref >60)
GFR calc non Af Amer: >60 mL/min (ref >60)
GLUCOSE: 125 mg/dL — AB (ref 65–99)
POTASSIUM: 4.2 mmol/L (ref 3.5–5.1)
Sodium: 137 mmol/L (ref 135–145)

## 2017-07-10 LAB — CBC
HEMATOCRIT: 33.7 % — AB (ref 36.0–46.0)
HEMOGLOBIN: 11.1 g/dL — AB (ref 12.0–15.0)
MCH: 27 pg (ref 26.0–34.0)
MCHC: 32.9 g/dL (ref 30.0–36.0)
MCV: 82 fL (ref 78.0–100.0)
Platelets: 297 10*3/uL (ref 150–400)
RBC: 4.11 MIL/uL (ref 3.87–5.11)
RDW: 13.4 % (ref 11.5–15.5)
WBC: 9 10*3/uL (ref 4.0–10.5)

## 2017-07-10 LAB — HIV ANTIBODY (ROUTINE TESTING W REFLEX): HIV SCREEN 4TH GENERATION: NONREACTIVE

## 2017-07-10 NOTE — Progress Notes (Signed)
1 Day Post-Op Ex lap, VHR, SBR  Subjective: Pain controlled, no nausea, tolerating clears, foley out, hasn't ambulated outside the room.    Objective: Vital signs in last 24 hours: Temp:  [97.4 F (36.3 C)-98 F (36.7 C)] 97.9 F (36.6 C) (03/09 0646) Pulse Rate:  [73-94] 73 (03/09 0646) Resp:  [15-22] 18 (03/09 0646) BP: (132-166)/(79-103) 132/79 (03/09 0646) SpO2:  [98 %-100 %] 98 % (03/09 0646) Weight:  [240 lb (108.9 kg)] 240 lb (108.9 kg) (03/08 1437)   Intake/Output from previous day: 03/08 0701 - 03/09 0700 In: 2095 [P.O.:240; I.V.:1800; IV Piggyback:55] Out: 2385 [Urine:2375; Blood:10] Intake/Output this shift: No intake/output data recorded.   General appearance: alert and cooperative GI: normal findings: soft, non-distended  Incision: slight bloody drainage at base of the wound  Lab Results:  Recent Labs    07/09/17 0926 07/10/17 0546  WBC 5.8 9.0  HGB 12.6 11.1*  HCT 38.3 33.7*  PLT 283 297   BMET Recent Labs    07/09/17 0926 07/10/17 0546  NA 140 137  K 3.2* 4.2  CL 109 108  CO2 20* 22  GLUCOSE 112* 125*  BUN 11 7  CREATININE 0.79 0.78  CALCIUM 9.0 8.4*   PT/INR No results for input(s): LABPROT, INR in the last 72 hours. ABG No results for input(s): PHART, HCO3 in the last 72 hours.  Invalid input(s): PCO2, PO2  MEDS, Scheduled . docusate sodium  100 mg Oral BID  . pantoprazole (PROTONIX) IV  40 mg Intravenous QHS    Studies/Results: Ct Abdomen Pelvis W Contrast  Addendum Date: 07/09/2017   ADDENDUM REPORT: 07/09/2017 16:56 ADDENDUM: After further review of the images with Dr. 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 As such, a majority of the small bowel is located to the right of midline and a majority of the colon is in the left abdomen. Electronically Signed   By: Bary RichardStan  Maynard M.D.   On: 07/09/2017 16:56   Result Date: 07/09/2017 CLINICAL DATA:  Periumbilical abdominal pain. History of umbilical cyst removal 3 years ago. EXAM:  CT ABDOMEN AND PELVIS WITH CONTRAST TECHNIQUE: Multidetector CT imaging of the abdomen and pelvis was performed using the standard protocol following bolus administration of intravenous contrast. CONTRAST:  100mL ISOVUE-300 IOPAMIDOL (ISOVUE-300) INJECTION 61% COMPARISON:  None. FINDINGS: Lower chest: No acute abnormality. Hepatobiliary: Ill-defined, somewhat curvilinear, hypodense focus is seen within the inferior right hepatic lobe, of uncertain etiology or significance, possibly focal intrahepatic bile duct ectasia. Liver otherwise unremarkable. Gallbladder appears normal. Pancreas: Unremarkable. No pancreatic ductal dilatation or surrounding inflammatory changes. Spleen: Normal in size without focal abnormality. Adrenals/Urinary Tract: Adrenal glands are unremarkable. Kidneys are normal, without renal calculi, focal lesion, or hydronephrosis. Bladder is unremarkable. Stomach/Bowel: Periumbilical abdominal wall hernia containing a small bowel loop. There is tethering of the mesenteric vessels at the umbilical hernia site. The small bowel immediately deep to the umbilical hernia site is mildly distended and has markedly thickened walls and there is surrounding mesenteric inflammation, highly suspicious for ischemic bowel, possibly associated incarceration. The more proximal small bowel is not distended. Large bowel is normal in caliber and configuration. Vascular/Lymphatic: No significant vascular findings are present. No enlarged abdominal or pelvic lymph nodes. Reproductive: Multiple uterine fibroids, both myometrial and exophytic, largest measuring 4-5 cm. No mass or fluid within either adnexal region. Other: No abscess collection.  No free intraperitoneal air. Musculoskeletal: No acute or significant osseous findings. IMPRESSION: 1. Umbilical abdominal wall hernia which contains a small bowel loop.  The small bowel immediately deep to the umbilical hernia site is mildly distended and has markedly thickened  walls with surrounding mesenteric inflammation, highly suspicious for ischemic bowel. There appears to be tethering of the adjacent mesenteric vessels and there may be some component of incarceration and/or closed-loop obstruction at the hernia site. Immediate surgical consultation recommended. 2. Small amount of free fluid in the right lower quadrant. 3. Subtle cover linear hypodense focus within the inferior right hepatic lobe, of uncertain etiology or significance. Differential considerations include focal intrahepatic bile duct dilatation/ectasia and hemangioma. Recommend nonemergent follow-up with liver MRI. 4. Leiomyomatous uterus. These results (in regards to the bowel) were called by telephone at the time of interpretation on 07/09/2017 at 12:49 pm to Dr. Carlena Bjornstad , who verbally acknowledged these results. Electronically Signed: By: Bary Richard M.D. On: 07/09/2017 12:49    Assessment: s/p Procedure(s): SMALL BOWEL RESECTION AND REPAIR OF STRANGULATED VENTRAL HERNIA WITH REMOVAL OF MECKEL DIVERTICULUM Patient Active Problem List   Diagnosis Date Noted  . Incarcerated umbilical hernia 07/09/2017    Doing well post op  Plan: Cont clears.  May advance to fulls if she starts having flatus  Ambulate in hall Cont IVF's   LOS: 0 days     .Vanita Panda, MD [facility], [place] [phone]   07/10/2017 10:15 AM

## 2017-07-11 NOTE — Progress Notes (Signed)
2 Days Post-Op Ex lap, VHR, SBR  Subjective: Pain controlled, no nausea, tolerating clears, ambulated twice yesterday outside the room.    Objective: Vital signs in last 24 hours: Temp:  [97.9 F (36.6 C)-98.4 F (36.9 C)] 98.3 F (36.8 C) (03/10 0420) Pulse Rate:  [72-82] 72 (03/10 0420) Resp:  [18] 18 (03/10 0420) BP: (140-174)/(73-96) 151/91 (03/10 0420) SpO2:  [97 %-100 %] 100 % (03/10 0420)   Intake/Output from previous day: 03/09 0701 - 03/10 0700 In: 2498.3 [I.V.:2498.3] Out: -  Intake/Output this shift: No intake/output data recorded.   General appearance: alert and cooperative GI: normal findings: soft, non-distended  Incision: slight bloody drainage at base of the wound, no erythema  Lab Results:  Recent Labs    07/09/17 0926 07/10/17 0546  WBC 5.8 9.0  HGB 12.6 11.1*  HCT 38.3 33.7*  PLT 283 297   BMET Recent Labs    07/09/17 0926 07/10/17 0546  NA 140 137  K 3.2* 4.2  CL 109 108  CO2 20* 22  GLUCOSE 112* 125*  BUN 11 7  CREATININE 0.79 0.78  CALCIUM 9.0 8.4*   PT/INR No results for input(s): LABPROT, INR in the last 72 hours. ABG No results for input(s): PHART, HCO3 in the last 72 hours.  Invalid input(s): PCO2, PO2  MEDS, Scheduled . docusate sodium  100 mg Oral BID  . pantoprazole (PROTONIX) IV  40 mg Intravenous QHS    Studies/Results: Ct Abdomen Pelvis W Contrast  Addendum Date: 07/09/2017   ADDENDUM REPORT: 07/09/2017 16:56 ADDENDUM: After further review of the images with Dr. 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 As such, a majority of the small bowel is located to the right of midline and a majority of the colon is in the left abdomen. Electronically Signed   By: Bary Richard M.D.   On: 07/09/2017 16:56   Result Date: 07/09/2017 CLINICAL DATA:  Periumbilical abdominal pain. History of umbilical cyst removal 3 years ago. EXAM: CT ABDOMEN AND PELVIS WITH CONTRAST TECHNIQUE: Multidetector CT imaging of the abdomen and pelvis  was performed using the standard protocol following bolus administration of intravenous contrast. CONTRAST:  ISOVUE-300 IOPAMIDOL (ISOVUE-300) INJECTION 61% COMPARISON:  None. FINDINGS: Lower chest: No acute abnormality. Hepatobiliary: Ill-defined, somewhat curvilinear, hypodense focus is seen within the inferior right hepatic lobe, of uncertain etiology or significance, possibly focal intrahepatic bile duct ectasia. Liver otherwise unremarkable. Gallbladder appears normal. Pancreas: Unremarkable. No pancreatic ductal dilatation or surrounding inflammatory changes. Spleen: Normal in size without focal abnormality. Adrenals/Urinary Tract: Adrenal glands are unremarkable. Kidneys are normal, without renal calculi, focal lesion, or hydronephrosis. Bladder is unremarkable. Stomach/Bowel: Periumbilical abdominal wall hernia containing a small bowel loop. There is tethering of the mesenteric vessels at the umbilical hernia site. The small bowel immediately deep to the umbilical hernia site is mildly distended and has markedly thickened walls and there is surrounding mesenteric inflammation, highly suspicious for ischemic bowel, possibly associated incarceration. The more proximal small bowel is not distended. Large bowel is normal in caliber and configuration. Vascular/Lymphatic: No significant vascular findings are present. No enlarged abdominal or pelvic lymph nodes. Reproductive: Multiple uterine fibroids, both myometrial and exophytic, largest measuring 4-5 cm. No mass or fluid within either adnexal region. Other: No abscess collection.  No free intraperitoneal air. Musculoskeletal: No acute or significant osseous findings. IMPRESSION: 1. Umbilical abdominal wall hernia which contains a small bowel loop. The small bowel immediately deep to the umbilical hernia site is mildly distended and has  markedly thickened walls with surrounding mesenteric inflammation, highly suspicious for ischemic bowel. There appears to  be tethering of the adjacent mesenteric vessels and there may be some component of incarceration and/or closed-loop obstruction at the hernia site. Immediate surgical consultation recommended. 2. Small amount of free fluid in the right lower quadrant. 3. Subtle cover linear hypodense focus within the inferior right hepatic lobe, of uncertain etiology or significance. Differential considerations include focal intrahepatic bile duct dilatation/ectasia and hemangioma. Recommend nonemergent follow-up with liver MRI. 4. Leiomyomatous uterus. These results (in regards to the bowel) were called by telephone at the time of interpretation on 07/09/2017 at 12:49 pm to Dr. Carlena BjornstadBROOKE MEUTH , who verbally acknowledged these results. Electronically Signed: By: Bary RichardStan  Maynard M.D. On: 07/09/2017 12:49    Assessment: s/p Procedure(s): SMALL BOWEL RESECTION AND REPAIR OF STRANGULATED VENTRAL HERNIA WITH REMOVAL OF MECKEL DIVERTICULUM Patient Active Problem List   Diagnosis Date Noted  . Incarcerated umbilical hernia 07/09/2017    Doing well post op  Plan: Cont clears.  May advance to fulls if she starts having flatus  Ambulate in hall Cont IVF's   LOS: 1 day     .Vanita PandaAlicia C Devoiry Corriher, MD [facility], [place] [phone]   07/11/2017 8:56 AM

## 2017-07-11 NOTE — Plan of Care (Signed)
  Pain Managment: General experience of comfort will improve 07/11/2017 2044 - Progressing by Antionette CharPeng, Sakira Dahmer P, RN

## 2017-07-12 NOTE — Progress Notes (Signed)
3 Days Post-Op   Subjective/Chief Complaint: No complaints. Passing flatus   Objective: Vital signs in last 24 hours: Temp:  [97.6 F (36.4 C)-98.4 F (36.9 C)] 98.4 F (36.9 C) (03/11 0459) Pulse Rate:  [77-94] 77 (03/11 0459) Resp:  [18] 18 (03/11 0459) BP: (138-145)/(79-99) 138/93 (03/11 0459) SpO2:  [99 %-100 %] 99 % (03/11 0459) Last BM Date: 07/09/17  Intake/Output from previous day: 03/10 0701 - 03/11 0700 In: 2708.3 [P.O.:1560; I.V.:1093.3; IV Piggyback:55] Out: -  Intake/Output this shift: No intake/output data recorded.  General appearance: alert and cooperative Resp: clear to auscultation bilaterally Cardio: regular rate and rhythm GI: soft, mild tenderness. good bs  Lab Results:  Recent Labs    07/09/17 0926 07/10/17 0546  WBC 5.8 9.0  HGB 12.6 11.1*  HCT 38.3 33.7*  PLT 283 297   BMET Recent Labs    07/09/17 0926 07/10/17 0546  NA 140 137  K 3.2* 4.2  CL 109 108  CO2 20* 22  GLUCOSE 112* 125*  BUN 11 7  CREATININE 0.79 0.78  CALCIUM 9.0 8.4*   PT/INR No results for input(s): LABPROT, INR in the last 72 hours. ABG No results for input(s): PHART, HCO3 in the last 72 hours.  Invalid input(s): PCO2, PO2  Studies/Results: No results found.  Anti-infectives: Anti-infectives (From admission, onward)   Start     Dose/Rate Route Frequency Ordered Stop   07/09/17 1530  cefoTEtan in Dextrose 5% (CEFOTAN) IVPB 2 g     2 g Intravenous On call to O.R. 07/09/17 1323 07/09/17 1522   07/09/17 1416  cefoTEtan in Dextrose 5% (CEFOTAN) 2-2.08 GM-%(50ML) IVPB    Comments:  Leslie Stein, Leslie Stein   : cabinet override      07/09/17 1416 07/09/17 1522      Assessment/Plan: s/p Procedure(s): SMALL BOWEL RESECTION AND REPAIR OF STRANGULATED VENTRAL HERNIA WITH REMOVAL OF MECKEL DIVERTICULUM (N/A) Advance diet as tolerated Ambulate Pod 3  LOS: 2 days    TOTH III,Shawntay Prest S 07/12/2017

## 2017-07-13 MED ORDER — ACETAMINOPHEN 325 MG PO TABS: ORAL_TABLET | ORAL | Status: DC

## 2017-07-13 MED ORDER — OXYCODONE-ACETAMINOPHEN 5-325 MG PO TABS: 1.0000 | ORAL_TABLET | ORAL | Status: DC | PRN

## 2017-07-13 MED ORDER — OXYCODONE-ACETAMINOPHEN 5-325 MG PO TABS: 1.0000 | ORAL_TABLET | ORAL | 0 refills | Status: DC | PRN

## 2017-07-13 MED ORDER — IBUPROFEN 200 MG PO TABS: ORAL_TABLET | ORAL | 2 refills | Status: DC

## 2017-07-13 MED ORDER — PANTOPRAZOLE SODIUM 40 MG PO TBEC: 40.0000 mg | DELAYED_RELEASE_TABLET | Freq: Every day | ORAL | Status: DC

## 2017-07-13 NOTE — Progress Notes (Signed)
4 Days Post-Op    CC: abdominal pain  Subjective: Patient looks good this a.m.  She is sitting up she is eating a regular breakfast.  She had some bowel movement but not a lot.  Her wound looks good.  Staples are still in place.  She is ambulating and pain is well controlled.  She is anxious to go home.  Objective: Vital signs in last 24 hours: Temp:  [98.1 F (36.7 C)-98.6 F (37 C)] 98.3 F (36.8 C) (03/12 0542) Pulse Rate:  [85-91] 85 (03/12 0542) Resp:  [16-18] 16 (03/12 0542) BP: (135-145)/(94-98) 135/94 (03/12 0542) SpO2:  [98 %-100 %] 98 % (03/12 0542) Last BM Date: 07/12/17(Per pt report) 840 p.o. 40 IV Voided x3 BM x1 Afebrile blood pressure is up somewhat.  Diastolic blood pressures in the 90s. BMP/CBC are essentially normal.    Intake/Output from previous day: 03/11 0701 - 03/12 0700 In: 880 [P.O.:840; I.V.:40] Out: -  Intake/Output this shift: No intake/output data recorded.  General appearance: alert, cooperative and no distress Resp: clear to auscultation bilaterally GI: soft, non-tender; bowel sounds normal; no masses,  no organomegaly  Lab Results:  No results for input(s): WBC, HGB, HCT, PLT in the last 72 hours.  BMET No results for input(s): NA, K, CL, CO2, GLUCOSE, BUN, CREATININE, CALCIUM in the last 72 hours. PT/INR No results for input(s): LABPROT, INR in the last 72 hours.  Recent Labs  Lab 07/09/17 0926  AST 20  ALT 18  ALKPHOS 82  BILITOT 0.3  PROT 7.8  ALBUMIN 3.8     Lipase     Component Value Date/Time   LIPASE 26 07/09/2017 0926   Prior to Admission medications   Medication Sig Start Date End Date Taking? Authorizing Provider  loratadine (CLARITIN) 10 MG tablet Take 10 mg by mouth daily.   Yes [provider]  norethindrone-ethinyl estradiol-iron (ESTROSTEP FE,TILIA FE,TRI-LEGEST FE) 1-20/1-30/1-35 MG-MCG tablet Take 1 tablet by mouth daily. Birth control   Yes [provider]  valACYclovir (VALTREX)  500 MG tablet Take 500 mg by mouth 2 (two) times daily.   Yes [provider]      Medications: . docusate sodium  100 mg Oral BID  . pantoprazole (PROTONIX) IV  40 mg Intravenous QHS    . 0.45 % NaCl with KCl 20 mEq / L 10 mL/hr at 07/13/17 0816  . methocarbamol (ROBAXIN)  IV 500 mg (07/11/17 1415)   Anti-infectives (From admission, onward)   Start     Dose/Rate Route Frequency Ordered Stop   07/09/17 1530  cefoTEtan in Dextrose 5% (CEFOTAN) IVPB 2 g     2 g Intravenous On call to O.R. 07/09/17 1323 07/09/17 1522   07/09/17 1416  cefoTEtan in Dextrose 5% (CEFOTAN) 2-2.08 GM-%(50ML) IVPB    Comments:  Viviano Simas   : cabinet override      07/09/17 1416 07/09/17 1522      Assessment/Plan    Strangulated ventral umbilical hernia and Meckel's diverticulum S/P open repair of strangulated ventral hernia and small bowel resection including Meckel's diverticulum, 07/09/17, Dr. Almond Lint    POD 4  FEN: IV fluids/regular diet ID: Preop: DVT: SCDs/adding Lovenox Foley: None Follow-up: Dr. Donell Beers  Plan: We will plan to discharge her home today.  We will have her get her staples out next week in the office.  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  Currently she is using Tylenol  and oxycodone for pain.  She is not using much for pain control at all.  I spoke with her and her mother who was on speaker phone about discharge and discharge instructions.  All questions were answered.  LOS: 3 days    Gem Conkle 07/13/2017 [phone]

## 2017-07-13 NOTE — Discharge Instructions (Signed)
Open Hernia Repair, Adult, Care After These instructions give you information about caring for yourself after your procedure. Your doctor may also give you more specific instructions. If you have problems or questions, contact your doctor. Follow these instructions at home: Surgical cut (incision) care   Follow instructions from your doctor about how to take care of your surgical cut area. Make sure you: ? Wash your hands with soap and water before you change your bandage (dressing). If you cannot use soap and water, use hand sanitizer. ? Change your bandage as told by your doctor. ? Leave stitches (sutures), skin glue, or skin tape (adhesive) strips in place. They may need to stay in place for 2 weeks or longer. If tape strips get loose and curl up, you may trim the loose edges. Do not remove tape strips completely unless your doctor says it is okay.  Check your surgical cut every day for signs of infection. Check for: ? More redness, swelling, or pain. ? More fluid or blood. ? Warmth. ? Pus or a bad smell. Activity  Do not drive or use heavy machinery while taking prescription pain medicine. Do not drive until your doctor says it is okay.  Until your doctor says it is okay: ? Do not lift anything that is heavier than 10 lb (4.5 kg). ? Do not play contact sports.  Return to your normal activities as told by your doctor. Ask your doctor what activities are safe. General instructions  To prevent or treat having a hard time pooping (constipation) while you are taking prescription pain medicine, your doctor may recommend that you: ? Drink enough fluid to keep your pee (urine) clear or pale yellow. ? Take over-the-counter or prescription medicines. ? Eat foods that are high in fiber, such as fresh fruits and vegetables, whole grains, and beans. ? Limit foods that are high in fat and processed sugars, such as fried and sweet foods.  Take over-the-counter and prescription medicines only as  told by your doctor.  Do not take baths, swim, or use a hot tub until your doctor says it is okay.  Keep all follow-up visits as told by your doctor. This is important. Contact a doctor if:  You develop a rash.  You have more redness, swelling, or pain around your surgical cut.  You have more fluid or blood coming from your surgical cut.  Your surgical cut feels warm to the touch.  You have pus or a bad smell coming from your surgical cut.  You have a fever or chills.  You have blood in your poop (stool).  You have not pooped in 2-3 days.  Medicine does not help your pain. Get help right away if:  You have chest pain or you are short of breath.  You feel light-headed.  You feel weak and dizzy (feel faint).  You have very bad pain.  You throw up (vomit) and your pain is worse. This information is not intended to replace advice given to you by your health care provider. Make sure you discuss any questions you have with your health care provider. Document Released: 05/11/2014 Document Revised: 11/08/2015 Document Reviewed: 10/02/2015 Elsevier Interactive Patient Education  2018 [employer]   CCS      [facility], [place] [phone]  OPEN ABDOMINAL SURGERY: POST OP INSTRUCTIONS No lifting over 10 pounds for 6 weeks.  Always review your discharge instruction sheet given to you by the facility where your surgery was performed.  IF YOU HAVE  DISABILITY OR FAMILY LEAVE FORMS, YOU MUST BRING THEM TO THE OFFICE FOR PROCESSING.  PLEASE DO NOT GIVE THEM TO YOUR DOCTOR.  1. A prescription for pain medication may be given to you upon discharge.  Take your pain medication as prescribed, if needed.  If narcotic pain medicine is not needed, then you may take acetaminophen (Tylenol) or ibuprofen (Advil) as needed. 2. Take your usually prescribed medications unless otherwise directed. 3. If you need a refill on your pain medication, please contact your pharmacy. They will  contact our office to request authorization.  Prescriptions will not be filled after 5pm or on week-ends. 4. You should follow a light diet the first few days after arrival home, such as soup and crackers, pudding, etc.unless your doctor has advised otherwise. A high-fiber, low fat diet can be resumed as tolerated.   Be sure to include lots of fluids daily. Most patients will experience some swelling and bruising on the chest and neck area.  Ice packs will help.  Swelling and bruising can take several days to resolve 5. Most patients will experience some swelling and bruising in the area of the incision. Ice pack will help. Swelling and bruising can take several days to resolve..  6. It is common to experience some constipation if taking pain medication after surgery.  Increasing fluid intake and taking a stool softener will usually help or prevent this problem from occurring.  A mild laxative (Milk of Magnesia or Miralax) should be taken according to package directions if there are no bowel movements after 48 hours. 7.  You may have steri-strips (small skin tapes) in place directly over the incision.  These strips should be left on the skin for 7-10 days.  If your surgeon used skin glue on the incision, you may shower in 24 hours.  The glue will flake off over the next 2-3 weeks.  Any sutures or staples will be removed at the office during your follow-up visit. You may find that a light gauze bandage over your incision may keep your staples from being rubbed or pulled. You may shower and replace the bandage daily. 8. ACTIVITIES:  You may resume regular (light) daily activities beginning the next day--such as daily self-care, walking, climbing stairs--gradually increasing activities as tolerated.  You may have sexual intercourse when it is comfortable.  Refrain from any heavy lifting or straining until approved by your doctor. a. You may drive when you no longer are taking prescription pain medication, you can  comfortably wear a seatbelt, and you can safely maneuver your car and apply brakes b. Return to Work: ___________________________________ 9. You should see your doctor in the office for a follow-up appointment approximately two weeks after your surgery.  Make sure that you call for this appointment within a day or two after you arrive home to insure a convenient appointment time. OTHER INSTRUCTIONS:  _____________________________________________________________ _____________________________________________________________  WHEN TO CALL YOUR DOCTOR: 1. Fever over 101.0 2. Inability to urinate 3. Nausea and/or vomiting 4. Extreme swelling or bruising 5. Continued bleeding from incision. 6. Increased pain, redness, or drainage from the incision. 7. Difficulty swallowing or breathing 8. Muscle cramping or spasms. 9. Numbness or tingling in hands or feet or around lips.  The clinic staff is available to answer your questions during regular business hours.  Please dont hesitate to call and ask to speak to one of the nurses if you have concerns.  For further questions, please visit [email or web address]

## 2017-07-13 NOTE — Progress Notes (Signed)
Patient has discharged to home on 07/13/17. Discharge instruction including medications and appointment was given to patient, No question at this time.

## 2017-07-13 NOTE — Progress Notes (Signed)
Key Points: Use following P&T approved IV to PO non-antibiotic change policy.  Description contains the criteria that are approved Note: Policy Excludes:  Esophagectomy patientsPHARMACIST - PHYSICIAN COMMUNICATION DR:   Carolynne Edouardoth CONCERNING: IV to Oral Route Change Policy  RECOMMENDATION: This patient is receiving protonix by the intravenous route.  Based on criteria approved by the Pharmacy and Therapeutics Committee, the intravenous medication(s) is/are being converted to the equivalent oral dose form(s).   DESCRIPTION: These criteria include:  The patient is eating (either orally or via tube) and/or has been taking other orally administered medications for a least 24 hours  The patient has no evidence of active gastrointestinal bleeding or impaired GI absorption (gastrectomy, short bowel, patient on TNA or NPO).  If you have questions about this conversion, please contact the Pharmacy Department  []   [phone]( [phone] )  [facility] []   [phone]( [phone] )  [facility]  []   [phone]( [phone] )  [facility] [x]   [phone]( [phone] )  [facility]  Earl ManyLegge, Verlyn Dannenberg BeaverMarshall, [facility] 07/13/2017 11:09 AM

## 2017-07-14 NOTE — Discharge Summary (Signed)
Physician Discharge Summary  Patient ID: Leslie Stein MRN: [id number] DOB/AGE: 29/08/1990 29 y.o.  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 Marcina MillardFaera Byerly        Hospital Course: Leslie Stein is a 27yo female who presented to the [facility] earlier today with acute onset umbilical pain. States that she had an umbilical cyst removed in the OR in [place] around 2014 as well as a laparoscopic ovarian cyst removal, and has had an umbilical hernia present for a few years. States that it will pop in/out about twice weekly but this is the first time she was unable to reduce it. States that she vomited once this morning due to the pain. She is passing flatus. Pain became constant and severe so she came to the ED. WBC 5.8, afebrile. CT scan shows umbilical abdominal wall hernia containing a small bowel loop with the small bowel immediately deep to the umbilical hernia site, it is mildly distended and has markedly thickened walls with surrounding mesenteric inflammation, highly suspicious for ischemic bowel. Concern for incarceration and/or closed-loop obstruction at the hernia site. Pt was seen in the ED by Dr. Donell BeersByerly.  CT showed small bowel in hernia and just below abdominal wall with changes suspicious for ischemia.  She took the patient to the OR at that time and she underwent the procedure list above.  She did well with this and returned to the floor.    Post op she had some post op ileus.  Her diet was increased as her bowel function returned.  By 07/13/17 her bowel function was better, she was tolerating a soft diet had bowels were working.  She was discharged home on a  soft diet.  Follow up for staple removal and to see Dr. Donell BeersByerly were set up and listed below.    Condition on d/c:  Improved  CBC Latest Ref Rng & Units 07/10/2017 07/09/2017  WBC 4.0 - 10.5 K/uL 9.0 5.8  Hemoglobin 12.0 - 15.0 g/dL 11.1(L) 12.6  Hematocrit 36.0 - 46.0 % 33.7(L) 38.3  Platelets 150 - 400 K/uL 297 283   CMP Latest Ref Rng & Units 07/10/2017 07/09/2017  Glucose 65 - 99 mg/dL 829(F125(H) 621(H112(H)  BUN 6 - 20 mg/dL 7 11  Creatinine 0.860.44 - 1.00 mg/dL 5.780.78 4.690.79  Sodium 629135 - 145 mmol/L 137 140  Potassium 3.5 - 5.1 mmol/L 4.2 3.2(L)  Chloride 101 - 111 mmol/L 108 109  CO2 22 - 32 mmol/L 22 20(L)  Calcium 8.9 - 10.3 mg/dL 5.2(W8.4(L) 9.0  Total Protein 6.5 - 8.1 g/dL - 7.8  Total Bilirubin 0.3 - 1.2 mg/dL - 0.3  Alkaline Phos 38 - 126 U/L - 82  AST 15 - 41 U/L - 20  ALT 14 - 54 U/L - 18   Small intestine, resection - SMALL BOWEL WITH TRANSMURAL ISCHEMIC CHANGES - MECKEL DIVERTICULUM WITH GASTRIC HETEROTOPIA - MARGINS VIABLE - NO MALIGNANCY IDENTIFIED  Disposition: 01-Home or Self Care   Allergies as of 07/13/2017      Reactions   Percocet [oxycodone-acetaminophen] Nausea And Vomiting      Medication List    TAKE these medications   acetaminophen 325 MG tablet Commonly known as:  TYLENOL You can take 2 tablets every  4 hours as needed for pain.  You can alternate this with ibuprofen, or Percocet. Tylenol is also in your prescribed pain medication.  Do not take more than 4000 mg of Tylenol per day.  You can buy this over-the-counter at any drugstore.   ibuprofen 200 MG tablet Commonly known as:  MOTRIN IB You can take 2-3 tablets every 6 hours as needed.  You can alternate this with plain Tylenol or the Percocet pain medication.  You can buy this over-the-counter at any drugstore.   loratadine 10 MG tablet Commonly known as:  CLARITIN Take 10 mg by mouth daily.   norethindrone-ethinyl estradiol-iron 1-20/1-30/1-35 MG-MCG tablet Commonly known as:  ESTROSTEP FE,TILIA  FE,TRI-LEGEST FE Take 1 tablet by mouth daily. Birth control   oxyCODONE-acetaminophen 5-325 MG tablet Commonly known as:  PERCOCET/ROXICET Take 1-2 tablets by mouth every 4 (four) hours as needed for moderate pain.   valACYclovir 500 MG tablet Commonly known as:  VALTREX Take 500 mg by mouth 2 (two) times daily.      Follow-up Information    [facility] Follow up on 07/21/2017.   Specialty:  General Surgery Why:  Your appointment is at 2:00 PM  for staple removal with the nurse.  Be at the office 30 minutes early for check-in.  Bring photo ID and insurance information. Contact information: [address] [place] [address] [phone]        Almond Lint, MD Follow up on 07/28/2017.   Specialty:  General Surgery Why:  Your appointment is at 3:00PM.  Be at the office 30 minutes early for check in. Contact information: [address] [place] [address] [phone]           Signed: Sherrie George 07/14/2017, 1:19 PM

## 2018-02-04 ENCOUNTER — Ambulatory Visit
Admission: RE | Admit: 2018-02-04 | Discharge: 2018-02-04 | Disposition: A | Discharge status: Home / Self Care | Payer: BLUE CROSS/BLUE SHIELD | Source: Ambulatory Visit | Attending: Student | Admitting: Student

## 2018-02-04 ENCOUNTER — Other Ambulatory Visit: Payer: Self-pay | Admitting: Student

## 2018-02-04 DIAGNOSIS — Q430 Meckel's diverticulum (displaced) (hypertrophic): Secondary | ICD-10-CM

## 2018-02-10 ENCOUNTER — Ambulatory Visit: Payer: Self-pay | Admitting: General Surgery

## 2018-02-10 NOTE — H&P (Signed)
  History of Present Illness Axel Filler MD; 02/10/2018 9:46 AM) The patient is a 27 year old female who presents with an incisional hernia. Chief Complaint: Incisional hernia  Patient is a 27 year old female who in March 2019 underwent an open strangling a ventral hernia repair as well as small bowel resection including Meckel's diverticulum for necrotic small bowel by Dr. Donell Beers. Patient states over the last 1-2 months she's noticeable left side of her incision. She's had some stinging type pain that resolved fairly quickly. Patient underwent CT scan which I did review personally which did reveal incisional hernia. The patient the small bowel within the hernia however the hernia is wid[place]. Previously patient had subumbilical cyst removal as well as ovarian cyst removal laparoscopically but these were approximately 6-8 years ago.   Allergies (Tanisha A. Manson Passey, RMA; 02/10/2018 9:22 AM) Percocet *ANALGESICS - OPIOID*  Allergies Reconciled   Medication History (Tanisha A. Manson Passey, RMA; 02/10/2018 9:22 AM) ValACYclovir HCl (500MG  Tablet, Oral) Active. Alyacen 1/35 (1-35MG -MCG Tablet, Oral) Active. Norethindrone Acet-Ethinyl Est (1-20MG -MCG Tablet, 1 Oral daily) Active. Medications Reconciled    Review of Systems Axel Filler, MD; 02/10/2018 9:47 AM) All other systems negative  Vitals (Tanisha A. Brown RMA; 02/10/2018 9:22 AM) 02/10/2018 9:21 AM Weight: 256.4 lb Height: 67in Body Surface Area: 2.25 m Body Mass Index: 40.16 kg/m  Temp.: 98.68F  Pulse: 68 (Regular)  BP: 118/78 (Sitting, Left Arm, Standard)       Physical Exam Axel Filler MD; 02/10/2018 9:46 AM) The physical exam findings are as follows: Note:Constitutional: No acute distress, conversant, appears stated age  Eyes: Anicteric sclerae, moist conjunctiva, no lid lag  Neck: No thyromegaly, trachea midline, no cervical lymphadenopathy  Lungs: Clear to auscultation biilaterally,  normal respiratory effot  Cardiovascular: regular rate & rhythm, no murmurs, no peripheal edema, pedal pulses 2+  GI: Soft, no masses or hepatosplenomegaly, non-tender to palpation  MSK: Normal gait, no clubbing cyanosis, edema  Skin: No rashes, palpation reveals normal skin turgor  Psychiatric: Appropriate judgment and insight, oriented to person, place, and time  Abdomen Inspection Hernias - Incisional - Reducible(Midline incisional hernia, approximately 2-3 cm of separation.).    Assessment & Plan Axel Filler MD; 02/10/2018 9:47 AM) Sherald Hess HERNIA, WITHOUT OBSTRUCTION OR GANGRENE (K43.2) Impression: 27 year old female with an incisional hernia. 1. The patient will like to proceed to the operating room for open incisional hernia repair with mesh, retrorectus  2. I discussed with the patient the signs and symptoms of incarceration and strangulation and the need to proceed to the ER should they occur.  3. I discussed with the patient the risks and benefits of the procedure to include but not limited to: Infection, bleeding, damage to surrounding structures, possible need for further surgery, possible nerve pain, and possible recurrence. The patient was understanding and wishes to proceed.

## 2018-03-11 NOTE — Pre-Procedure Instructions (Signed)
Leslie Stein  03/11/2018      [id number] [place], [place] - [address] [place] [address] Phone: [phone] Fax: [phone]    Your procedure is scheduled on Tues., Nov. 19, 2019 from 7:30AM-10:00AM  Report to [facility] Admitting Entrance "A" at 5:30AM  Call this number if you have problems the morning of surgery:  [phone]   Remember:  Do not eat after midnight on Nov. 18th  You may drink clear liquids until 3 hours (4:30AM) prior to surgery .  Clear liquids allowed are:   Water and Gatorade   Please complete your PRE-SURGERY ENSURE that was given to by (4:30AM) the morning of surgery.  Please, if able, drink it in one setting. DO NOT SIP.    Take these medicines the morning of surgery with A SIP OF WATER: Fluticasone (FLONASE), Loratadine (CLARITIN), Norethindrone-ethinyl estradiol 1/35 ([name] 1/35), and ValACYclovir (VALTREX)  7 days before surgery (03/15/18), stop taking all Other Aspirin Products, Vitamins, Fish oils, and Herbal medications. Also stop all NSAIDS i.e. Advil, Ibuprofen, Motrin, Aleve, Anaprox, Naproxen, BC, Goody Powders, and all Supplements.    Do not wear jewelry, make-up or nail polish.  Do not wear lotions, powders, or perfumes, or deodorant.  Do not shave 48 hours prior to surgery.    Do not bring valuables to the hospital.  [facility] is not responsible for any belongings or valuables.  Contacts, dentures or bridgework may not be worn into surgery.  Leave your suitcase in the car.  After surgery it may be brought to your room.  For patients admitted to the hospital, discharge time will be determined by your treatment team.  Patients discharged the day of surgery will not be allowed to drive home.   Special instructions:  [facility]- Preparing For Surgery  Before surgery, you can play an important role. Because skin is not sterile, your skin needs to be as free of germs as possible.  You can reduce the number of germs on your skin by washing with CHG (chlorahexidine gluconate) Soap before surgery.  CHG is an antiseptic cleaner which kills germs and bonds with the skin to continue killing germs even after washing.    Oral Hygiene is also important to reduce your risk of infection.  Remember - BRUSH YOUR TEETH THE MORNING OF SURGERY WITH YOUR REGULAR TOOTHPASTE  Please do not use if you have an allergy to CHG or antibacterial soaps. If your skin becomes reddened/irritated stop using the CHG.  Do not shave (including legs and underarms) for at least 48 hours prior to first CHG shower. It is OK to shave your face.  Please follow these instructions carefully.   1. Shower the NIGHT BEFORE SURGERY and the MORNING OF SURGERY with CHG.   2. If you chose to wash your hair, wash your hair first as usual with your normal shampoo.  3. After you shampoo, rinse your hair and body thoroughly to remove the shampoo.  4. Use CHG as you would any other liquid soap. You can apply CHG directly to the skin and wash gently with a scrungie or a clean washcloth.   5. Apply the CHG Soap to your body ONLY FROM THE NECK DOWN.  Do not use on open wounds or open sores. Avoid contact with your eyes, ears, mouth and genitals (private parts). Wash Face and genitals (private parts)  with your normal soap.  6. Wash thoroughly, paying special attention to the  area where your surgery will be performed.  7. Thoroughly rinse your body with warm water from the neck down.  8. DO NOT shower/wash with your normal soap after using and rinsing off the CHG Soap.  9. Pat yourself dry with a CLEAN TOWEL.  10. Wear CLEAN PAJAMAS to bed the night before surgery, wear comfortable clothes the morning of surgery  11. Place CLEAN SHEETS on your bed the night of your first shower and DO NOT SLEEP WITH PETS.  Day of Surgery:  Do not apply any deodorants/lotions.  Please wear clean clothes to the hospital/surgery  center.   Remember to brush your teeth WITH YOUR REGULAR TOOTHPASTE.  Please read over the following fact sheets that you were given. Pain Booklet, Coughing and Deep Breathing and Surgical Site Infection Prevention

## 2018-03-14 ENCOUNTER — Encounter (HOSPITAL_COMMUNITY): Payer: Self-pay

## 2018-03-14 ENCOUNTER — Encounter (HOSPITAL_COMMUNITY)
Admission: RE | Admit: 2018-03-14 | Discharge: 2018-03-14 | Disposition: A | Discharge status: Home / Self Care | Payer: BLUE CROSS/BLUE SHIELD | Source: Ambulatory Visit | Attending: General Surgery | Admitting: General Surgery

## 2018-03-14 ENCOUNTER — Other Ambulatory Visit: Payer: Self-pay

## 2018-03-14 DIAGNOSIS — Z01812 Encounter for preprocedural laboratory examination: Secondary | ICD-10-CM | POA: Diagnosis not present

## 2018-03-14 HISTORY — DX: Unspecified asthma, uncomplicated: J45.909

## 2018-03-14 LAB — BASIC METABOLIC PANEL
ANION GAP: 6 (ref 5–15)
BUN: 13 mg/dL (ref 6–20)
CHLORIDE: 108 mmol/L (ref 98–111)
CO2: 23 mmol/L (ref 22–32)
Calcium: 8.8 mg/dL — ABNORMAL LOW (ref 8.9–10.3)
Creatinine, Ser: 0.81 mg/dL (ref 0.44–1.00)
GFR calc Af Amer: >60 mL/min (ref >60)
GLUCOSE: 88 mg/dL (ref 70–99)
POTASSIUM: 3.8 mmol/L (ref 3.5–5.1)
Sodium: 137 mmol/L (ref 135–145)

## 2018-03-14 LAB — CBC
HEMATOCRIT: 37.8 % (ref 36.0–46.0)
HEMOGLOBIN: 11.7 g/dL — AB (ref 12.0–15.0)
MCH: 26.1 pg (ref 26.0–34.0)
MCHC: 31 g/dL (ref 30.0–36.0)
MCV: 84.4 fL (ref 80.0–100.0)
NRBC: 0 % (ref 0.0–0.2)
PLATELETS: 285 10*3/uL (ref 150–400)
RBC: 4.48 MIL/uL (ref 3.87–5.11)
RDW: 12.9 % (ref 11.5–15.5)
WBC: 4.8 10*3/uL (ref 4.0–10.5)

## 2018-03-14 NOTE — Progress Notes (Signed)
PCP - Dr. Blanca Friend  Chest x-ray - N/A EKG - N/A Blood Thinner Instructions: N/A Aspirin Instructions: N/A   Anesthesia review: none  Patient denies shortness of breath, fever, cough and chest pain at PAT appointment   Patient verbalized understanding of instructions that were given to them at the PAT appointment. Patient was also instructed that they will need to review over the PAT instructions again at home before surgery.

## 2018-03-14 NOTE — Progress Notes (Signed)
Leslie Stein            03/11/2018                          [id number] [name], [place] - [address] [name] [name] [address] Phone: [phone] Fax: [phone]              Your procedure is scheduled on Tues., Nov. 18, 2019             Report to [facility] Admitting Entrance at 5:30AM            Call this number if you have problems the morning of surgery:            [phone]             Remember:            Do not eat after midnight on Nov. 17th            You may drink clear liquids until 3 hours (4:30AM) prior to surgery .  Clear liquids allowed are:   Water and Gatorade   Please complete your PRE-SURGERY ENSURE that was given to by (4:30AM) the morning of surgery.  Please, if able, drink it in one setting. DO NOT SIP.                        Take these medicines the morning of surgery with A SIP OF WATER: Fluticasone (FLONASE), Loratadine (CLARITIN), Norethindrone-ethinyl estradiol 1/35 ([name] 1/35), and ValACYclovir (VALTREX)  7 days prior to surgery STOP taking any Aspirin(unless otherwise instructed by your surgeon), Aleve, Naproxen, Ibuprofen, Motrin, Advil, Goody's, BC's, all herbal medications, fish oil, and all vitamins               Do not wear jewelry, make-up or nail polish.            Do not wear lotions, powders, or perfumes, or deodorant.            Do not shave 48 hours prior to surgery.              Do not bring valuables to the hospital.            [facility] is not responsible for any belongings or valuables.  Contacts, dentures or bridgework may not be worn into surgery.  Leave your suitcase in the car.  After surgery it may be brought to your room.  For patients admitted to the hospital, discharge time will be determined by your treatment team.  Patients discharged the day of surgery will not be allowed to drive home.   Special instructions:  [facility]- Preparing For  Surgery  Before surgery, you can play an important role. Because skin is not sterile, your skin needs to be as free of germs as possible. You can reduce the number of germs on your skin by washing with CHG (chlorahexidine gluconate) Soap before surgery.  CHG is an antiseptic cleaner which kills germs and bonds with the skin to continue killing germs even after washing.    Oral Hygiene is also important to reduce your risk of infection.  Remember - BRUSH YOUR TEETH THE MORNING OF SURGERY WITH YOUR REGULAR TOOTHPASTE  Please do not use if you have an allergy to CHG or antibacterial soaps.  If your skin becomes reddened/irritated stop using the CHG.  Do not shave (including legs and underarms) for at least 48 hours prior to first CHG shower. It is OK to shave your face.  Please follow these instructions carefully.                                                                                                                     1. Shower the NIGHT BEFORE SURGERY and the MORNING OF SURGERY with CHG.   2. If you chose to wash your hair, wash your hair first as usual with your normal shampoo.  3. After you shampoo, rinse your hair and body thoroughly to remove the shampoo.  4. Use CHG as you would any other liquid soap. You can apply CHG directly to the skin and wash gently with a scrungie or a clean washcloth.   5. Apply the CHG Soap to your body ONLY FROM THE NECK DOWN.  Do not use on open wounds or open sores. Avoid contact with your eyes, ears, mouth and genitals (private parts). Wash Face and genitals (private parts)  with your normal soap.  6. Wash thoroughly, paying special attention to the area where your surgery will be performed.  7. Thoroughly rinse your body with warm water from the neck down.  8. DO NOT shower/wash with your normal soap after using and rinsing off the CHG Soap.  9. Pat yourself dry with a CLEAN TOWEL.  10. Wear CLEAN PAJAMAS to bed the night before  surgery, wear comfortable clothes the morning of surgery  11. Place CLEAN SHEETS on your bed the night of your first shower and DO NOT SLEEP WITH PETS.  Day of Surgery:  Do not apply any deodorants/lotions.  Please wear clean clothes to the hospital/surgery center.   Remember to brush your teeth WITH YOUR REGULAR TOOTHPASTE.  Please read over the following fact sheets that you were given.

## 2018-03-20 NOTE — Anesthesia Preprocedure Evaluation (Addendum)
Anesthesia Evaluation  Patient identified by MRN, date of birth, ID band Patient awake    Reviewed: Allergy & Precautions, NPO status , Patient's Chart, lab work & pertinent test results  Airway Mallampati: I  TM Distance: >3 FB Neck ROM: Full    Dental no notable dental hx. (+) Teeth Intact, Dental Advisory Given   Pulmonary asthma ,    Pulmonary exam normal breath sounds clear to auscultation       Cardiovascular negative cardio ROS Normal cardiovascular exam Rhythm:Regular Rate:Normal     Neuro/Psych negative neurological ROS  negative psych ROS   GI/Hepatic negative GI ROS, Neg liver ROS,   Endo/Other  Morbid obesity  Renal/GU negative Renal ROS  negative genitourinary   Musculoskeletal negative musculoskeletal ROS (+)   Abdominal   Peds  Hematology negative hematology ROS (+)   Anesthesia Other Findings Incisional hernia  Reproductive/Obstetrics                            Anesthesia Physical Anesthesia Plan  ASA: III  Anesthesia Plan: General   Post-op Pain Management:    Induction: Intravenous  PONV Risk Score and Plan: 3 and Ondansetron, Dexamethasone and Midazolam  Airway Management Planned: Oral ETT  Additional Equipment:   Intra-op Plan:   Post-operative Plan: Extubation in OR  Informed Consent: I have reviewed the patients History and Physical, chart, labs and discussed the procedure including the risks, benefits and alternatives for the proposed anesthesia with the patient or authorized representative who has indicated his/her understanding and acceptance.   Dental advisory given  Plan Discussed with: CRNA  Anesthesia Plan Comments:         Anesthesia Quick Evaluation

## 2018-03-21 ENCOUNTER — Observation Stay (HOSPITAL_COMMUNITY)
Admission: RE | Admit: 2018-03-21 | Discharge: 2018-03-22 | Disposition: A | Discharge status: Home / Self Care | Payer: BLUE CROSS/BLUE SHIELD | Source: Ambulatory Visit | Attending: General Surgery | Admitting: General Surgery

## 2018-03-21 ENCOUNTER — Ambulatory Visit (HOSPITAL_COMMUNITY): Payer: BLUE CROSS/BLUE SHIELD | Admitting: Anesthesiology

## 2018-03-21 ENCOUNTER — Encounter (HOSPITAL_COMMUNITY): Payer: Self-pay | Admitting: Certified Registered Nurse Anesthetist

## 2018-03-21 ENCOUNTER — Encounter (HOSPITAL_COMMUNITY)
Admission: RE | Disposition: A | Discharge status: Home / Self Care | Payer: Self-pay | Source: Ambulatory Visit | Attending: General Surgery

## 2018-03-21 DIAGNOSIS — Z886 Allergy status to analgesic agent status: Secondary | ICD-10-CM | POA: Diagnosis not present

## 2018-03-21 DIAGNOSIS — Z79899 Other long term (current) drug therapy: Secondary | ICD-10-CM | POA: Diagnosis not present

## 2018-03-21 DIAGNOSIS — Z9889 Other specified postprocedural states: Secondary | ICD-10-CM

## 2018-03-21 DIAGNOSIS — K432 Incisional hernia without obstruction or gangrene: Principal | ICD-10-CM | POA: Insufficient documentation

## 2018-03-21 DIAGNOSIS — Z8719 Personal history of other diseases of the digestive system: Secondary | ICD-10-CM

## 2018-03-21 DIAGNOSIS — Z9049 Acquired absence of other specified parts of digestive tract: Secondary | ICD-10-CM | POA: Insufficient documentation

## 2018-03-21 DIAGNOSIS — Z885 Allergy status to narcotic agent status: Secondary | ICD-10-CM | POA: Diagnosis not present

## 2018-03-21 HISTORY — PX: INCISIONAL HERNIA REPAIR: SHX193

## 2018-03-21 HISTORY — PX: INSERTION OF MESH: SHX5868

## 2018-03-21 LAB — POCT PREGNANCY, URINE: Preg Test, Ur: NEGATIVE

## 2018-03-21 SURGERY — REPAIR, HERNIA, INCISIONAL
Anesthesia: General | Site: Abdomen

## 2018-03-21 MED ORDER — CEFAZOLIN SODIUM-DEXTROSE 2-4 GM/100ML-% IV SOLN
INTRAVENOUS | Status: AC
  Filled 2018-03-21: qty 100

## 2018-03-21 MED ORDER — LIDOCAINE 2% (20 MG/ML) 5 ML SYRINGE
INTRAMUSCULAR | Status: DC | PRN
  Administered 2018-03-21: 100 mg via INTRAVENOUS

## 2018-03-21 MED ORDER — TRAMADOL HCL 50 MG PO TABS
50.0000 mg | ORAL_TABLET | Freq: Four times a day (QID) | ORAL | Status: DC | PRN
  Administered 2018-03-21 – 2018-03-22 (×2): 50 mg via ORAL
  Filled 2018-03-21 (×2): qty 1

## 2018-03-21 MED ORDER — 0.9 % SODIUM CHLORIDE (POUR BTL) OPTIME
TOPICAL | Status: DC | PRN
  Administered 2018-03-21: 1000 mL

## 2018-03-21 MED ORDER — BUPIVACAINE-EPINEPHRINE (PF) 0.25% -1:200000 IJ SOLN
INTRAMUSCULAR | Status: DC | PRN
  Administered 2018-03-21: 30 mL

## 2018-03-21 MED ORDER — ROCURONIUM BROMIDE 10 MG/ML (PF) SYRINGE
PREFILLED_SYRINGE | INTRAVENOUS | Status: DC | PRN
  Administered 2018-03-21: 20 mg via INTRAVENOUS
  Administered 2018-03-21: 50 mg via INTRAVENOUS
  Administered 2018-03-21: 20 mg via INTRAVENOUS

## 2018-03-21 MED ORDER — DEXAMETHASONE SODIUM PHOSPHATE 10 MG/ML IJ SOLN
INTRAMUSCULAR | Status: DC | PRN
  Administered 2018-03-21: 10 mg via INTRAVENOUS

## 2018-03-21 MED ORDER — CHLORHEXIDINE GLUCONATE CLOTH 2 % EX PADS: 6.0000 | MEDICATED_PAD | Freq: Once | CUTANEOUS | Status: DC

## 2018-03-21 MED ORDER — ESMOLOL HCL 100 MG/10ML IV SOLN
INTRAVENOUS | Status: DC | PRN
  Administered 2018-03-21: 30 mg via INTRAVENOUS

## 2018-03-21 MED ORDER — FENTANYL CITRATE (PF) 250 MCG/5ML IJ SOLN
INTRAMUSCULAR | Status: AC
  Filled 2018-03-21: qty 5

## 2018-03-21 MED ORDER — CELECOXIB 200 MG PO CAPS: 200.0000 mg | ORAL_CAPSULE | ORAL | Status: DC

## 2018-03-21 MED ORDER — ACETAMINOPHEN 500 MG PO TABS
ORAL_TABLET | ORAL | Status: AC
  Administered 2018-03-21: 1000 mg
  Filled 2018-03-21: qty 2

## 2018-03-21 MED ORDER — ONDANSETRON HCL 4 MG/2ML IJ SOLN
INTRAMUSCULAR | Status: AC
  Filled 2018-03-21: qty 2

## 2018-03-21 MED ORDER — GABAPENTIN 300 MG PO CAPS
ORAL_CAPSULE | ORAL | Status: AC
  Administered 2018-03-21: 300 mg
  Filled 2018-03-21: qty 1

## 2018-03-21 MED ORDER — ROCURONIUM BROMIDE 50 MG/5ML IV SOSY
PREFILLED_SYRINGE | INTRAVENOUS | Status: AC
  Filled 2018-03-21: qty 5

## 2018-03-21 MED ORDER — KETOROLAC TROMETHAMINE 30 MG/ML IJ SOLN
30.0000 mg | Freq: Four times a day (QID) | INTRAMUSCULAR | Status: DC | PRN
  Administered 2018-03-21 – 2018-03-22 (×2): 30 mg via INTRAVENOUS
  Filled 2018-03-21 (×2): qty 1

## 2018-03-21 MED ORDER — METOPROLOL TARTRATE 5 MG/5ML IV SOLN
INTRAVENOUS | Status: DC | PRN
  Administered 2018-03-21: 2 mg via INTRAVENOUS

## 2018-03-21 MED ORDER — ACETAMINOPHEN 500 MG PO TABS: 1000.0000 mg | ORAL_TABLET | ORAL | Status: DC

## 2018-03-21 MED ORDER — CELECOXIB 200 MG PO CAPS
ORAL_CAPSULE | ORAL | Status: AC
  Administered 2018-03-21: 200 mg
  Filled 2018-03-21: qty 1

## 2018-03-21 MED ORDER — BUPIVACAINE LIPOSOME 1.3 % IJ SUSP
INTRAMUSCULAR | Status: DC | PRN
  Administered 2018-03-21: 20 mL

## 2018-03-21 MED ORDER — ONDANSETRON HCL 4 MG/2ML IJ SOLN: 4.0000 mg | Freq: Four times a day (QID) | INTRAMUSCULAR | Status: DC | PRN

## 2018-03-21 MED ORDER — PROPOFOL 10 MG/ML IV BOLUS
INTRAVENOUS | Status: AC
  Filled 2018-03-21: qty 20

## 2018-03-21 MED ORDER — CEFAZOLIN SODIUM-DEXTROSE 2-4 GM/100ML-% IV SOLN
2.0000 g | INTRAVENOUS | Status: AC
  Administered 2018-03-21: 2 g via INTRAVENOUS

## 2018-03-21 MED ORDER — SUCCINYLCHOLINE CHLORIDE 200 MG/10ML IV SOSY
PREFILLED_SYRINGE | INTRAVENOUS | Status: AC
  Filled 2018-03-21: qty 10

## 2018-03-21 MED ORDER — FENTANYL CITRATE (PF) 100 MCG/2ML IJ SOLN
25.0000 ug | INTRAMUSCULAR | Status: DC | PRN
  Administered 2018-03-21 (×3): 50 ug via INTRAVENOUS

## 2018-03-21 MED ORDER — BUPIVACAINE LIPOSOME 1.3 % IJ SUSP
20.0000 mL | Freq: Once | INTRAMUSCULAR | Status: DC
  Filled 2018-03-21: qty 20

## 2018-03-21 MED ORDER — ENSURE PRE-SURGERY PO LIQD
296.0000 mL | Freq: Once | ORAL | Status: DC
  Filled 2018-03-21: qty 296

## 2018-03-21 MED ORDER — PROPOFOL 10 MG/ML IV BOLUS
INTRAVENOUS | Status: DC | PRN
  Administered 2018-03-21: 150 mg via INTRAVENOUS

## 2018-03-21 MED ORDER — SODIUM CHLORIDE 0.9 % IV SOLN
INTRAVENOUS | Status: DC
  Administered 2018-03-21 (×2): via INTRAVENOUS

## 2018-03-21 MED ORDER — ONDANSETRON HCL 4 MG/2ML IJ SOLN
4.0000 mg | Freq: Once | INTRAMUSCULAR | Status: AC
  Administered 2018-03-21: 4 mg via INTRAVENOUS

## 2018-03-21 MED ORDER — SUGAMMADEX SODIUM 500 MG/5ML IV SOLN
INTRAVENOUS | Status: AC
  Filled 2018-03-21: qty 5

## 2018-03-21 MED ORDER — MIDAZOLAM HCL 2 MG/2ML IJ SOLN
INTRAMUSCULAR | Status: AC
  Filled 2018-03-21: qty 2

## 2018-03-21 MED ORDER — LIDOCAINE 2% (20 MG/ML) 5 ML SYRINGE
INTRAMUSCULAR | Status: AC
  Filled 2018-03-21: qty 5

## 2018-03-21 MED ORDER — METHOCARBAMOL 500 MG PO TABS
500.0000 mg | ORAL_TABLET | Freq: Four times a day (QID) | ORAL | Status: DC | PRN
  Administered 2018-03-21 – 2018-03-22 (×3): 500 mg via ORAL
  Filled 2018-03-21 (×3): qty 1

## 2018-03-21 MED ORDER — ESMOLOL HCL 100 MG/10ML IV SOLN
INTRAVENOUS | Status: AC
  Filled 2018-03-21: qty 10

## 2018-03-21 MED ORDER — GABAPENTIN 300 MG PO CAPS: 300.0000 mg | ORAL_CAPSULE | ORAL | Status: DC

## 2018-03-21 MED ORDER — FENTANYL CITRATE (PF) 100 MCG/2ML IJ SOLN
INTRAMUSCULAR | Status: AC
  Filled 2018-03-21: qty 2

## 2018-03-21 MED ORDER — ONDANSETRON 4 MG PO TBDP: 4.0000 mg | ORAL_TABLET | Freq: Four times a day (QID) | ORAL | Status: DC | PRN

## 2018-03-21 MED ORDER — MIDAZOLAM HCL 2 MG/2ML IJ SOLN
INTRAMUSCULAR | Status: DC | PRN
  Administered 2018-03-21 (×2): 1 mg via INTRAVENOUS

## 2018-03-21 MED ORDER — FENTANYL CITRATE (PF) 250 MCG/5ML IJ SOLN
INTRAMUSCULAR | Status: DC | PRN
  Administered 2018-03-21: 50 ug via INTRAVENOUS
  Administered 2018-03-21: 100 ug via INTRAVENOUS
  Administered 2018-03-21 (×3): 50 ug via INTRAVENOUS
  Administered 2018-03-21: 100 ug via INTRAVENOUS

## 2018-03-21 MED ORDER — TRAMADOL HCL 50 MG PO TABS: 50.0000 mg | ORAL_TABLET | Freq: Four times a day (QID) | ORAL | 0 refills | Status: AC | PRN

## 2018-03-21 MED ORDER — LACTATED RINGERS IV SOLN
INTRAVENOUS | Status: DC | PRN
  Administered 2018-03-21: 07:00:00 via INTRAVENOUS

## 2018-03-21 MED ORDER — PHENYLEPHRINE 40 MCG/ML (10ML) SYRINGE FOR IV PUSH (FOR BLOOD PRESSURE SUPPORT)
PREFILLED_SYRINGE | INTRAVENOUS | Status: AC
  Filled 2018-03-21: qty 10

## 2018-03-21 MED ORDER — DEXAMETHASONE SODIUM PHOSPHATE 10 MG/ML IJ SOLN
INTRAMUSCULAR | Status: AC
  Filled 2018-03-21: qty 1

## 2018-03-21 MED ORDER — BUPIVACAINE-EPINEPHRINE (PF) 0.25% -1:200000 IJ SOLN
INTRAMUSCULAR | Status: AC
  Filled 2018-03-21: qty 30

## 2018-03-21 MED ORDER — SUGAMMADEX SODIUM 500 MG/5ML IV SOLN
INTRAVENOUS | Status: DC | PRN
  Administered 2018-03-21: 300 mg via INTRAVENOUS

## 2018-03-21 MED ORDER — METOPROLOL TARTRATE 5 MG/5ML IV SOLN
INTRAVENOUS | Status: AC
  Filled 2018-03-21: qty 5

## 2018-03-21 SURGICAL SUPPLY — 48 items
BINDER ABD UNIV 10 28-50 (GAUZE/BANDAGES/DRESSINGS) ×1 IMPLANT
BINDER ABDOM UNIV 10 (GAUZE/BANDAGES/DRESSINGS) ×3
BLADE SURG 11 STRL SS (BLADE) ×3 IMPLANT
COVER SURGICAL LIGHT HANDLE (MISCELLANEOUS) ×3 IMPLANT
DERMABOND ADVANCED (GAUZE/BANDAGES/DRESSINGS) ×2
DERMABOND ADVANCED .7 DNX12 (GAUZE/BANDAGES/DRESSINGS) ×1 IMPLANT
DEVICE TROCAR PUNCTURE CLOSURE (ENDOMECHANICALS) ×3 IMPLANT
DRAPE INCISE IOBAN 66X45 STRL (DRAPES) ×3 IMPLANT
DRAPE LAPAROSCOPIC ABDOMINAL (DRAPES) ×3 IMPLANT
ELECT REM PT RETURN 9FT ADLT (ELECTROSURGICAL) ×3
ELECTRODE REM PT RTRN 9FT ADLT (ELECTROSURGICAL) ×1 IMPLANT
GLOVE BIO SURGEON STRL SZ7.5 (GLOVE) ×3 IMPLANT
GLOVE BIOGEL PI IND STRL 6 (GLOVE) ×1 IMPLANT
GLOVE BIOGEL PI IND STRL 6.5 (GLOVE) ×2 IMPLANT
GLOVE BIOGEL PI IND STRL 7.0 (GLOVE) ×1 IMPLANT
GLOVE BIOGEL PI IND STRL 7.5 (GLOVE) ×1 IMPLANT
GLOVE BIOGEL PI IND STRL 8 (GLOVE) ×1 IMPLANT
GLOVE BIOGEL PI INDICATOR 6 (GLOVE) ×2
GLOVE BIOGEL PI INDICATOR 6.5 (GLOVE) ×4
GLOVE BIOGEL PI INDICATOR 7.0 (GLOVE) ×2
GLOVE BIOGEL PI INDICATOR 7.5 (GLOVE) ×2
GLOVE BIOGEL PI INDICATOR 8 (GLOVE) ×2
GLOVE ECLIPSE 7.5 STRL STRAW (GLOVE) ×3 IMPLANT
GLOVE SURG SS PI 6.5 STRL IVOR (GLOVE) ×3 IMPLANT
GLOVE SURG SS PI 7.0 STRL IVOR (GLOVE) ×3 IMPLANT
GOWN STRL REUS W/ TWL LRG LVL3 (GOWN DISPOSABLE) ×3 IMPLANT
GOWN STRL REUS W/ TWL XL LVL3 (GOWN DISPOSABLE) ×1 IMPLANT
GOWN STRL REUS W/TWL LRG LVL3 (GOWN DISPOSABLE) ×6
GOWN STRL REUS W/TWL XL LVL3 (GOWN DISPOSABLE) ×2
KIT BASIN OR (CUSTOM PROCEDURE TRAY) ×3 IMPLANT
KIT TURNOVER KIT B (KITS) ×3 IMPLANT
MESH ULTRAPRO 6X6 15CM15CM (Mesh General) ×3 IMPLANT
NEEDLE 18GX1X1/2 (RX/OR ONLY) (NEEDLE) ×3 IMPLANT
NS IRRIG 1000ML POUR BTL (IV SOLUTION) ×3 IMPLANT
PACK GENERAL/GYN (CUSTOM PROCEDURE TRAY) ×3 IMPLANT
PAD ARMBOARD 7.5X6 YLW CONV (MISCELLANEOUS) ×6 IMPLANT
PENCIL SMOKE EVAC W/HOLSTER (ELECTROSURGICAL) ×3 IMPLANT
STAPLER VISISTAT 35W (STAPLE) ×3 IMPLANT
SUT MNCRL AB 4-0 PS2 18 (SUTURE) ×3 IMPLANT
SUT NOVA NAB GS-21 0 18 T12 DT (SUTURE) ×6 IMPLANT
SUT PDS AB 0 CT 36 (SUTURE) ×6 IMPLANT
SUT PDS AB 1 CT  36 (SUTURE) ×4
SUT PDS AB 1 CT 36 (SUTURE) ×2 IMPLANT
SUT VIC AB 2-0 SH 18 (SUTURE) ×3 IMPLANT
SUT VICRYL AB 2 0 TIES (SUTURE) ×3 IMPLANT
SYR CONTROL 10ML LL (SYRINGE) ×3 IMPLANT
TOWEL GREEN STERILE FF (TOWEL DISPOSABLE) ×3 IMPLANT
TRAY FOLEY CATH 16FR SILVER (SET/KITS/TRAYS/PACK) ×3 IMPLANT

## 2018-03-21 NOTE — Transfer of Care (Signed)
Immediate Anesthesia Transfer of Care Note  Patient: Leslie Stein  Procedure(s) Performed: OPEN INCISIONAL HERNIA REPAIR WITH MESH, RETRORECTUS (N/A Abdomen) INSERTION OF MESH (N/A Abdomen)  Patient Location: PACU  Anesthesia Type:General  Level of Consciousness: awake, alert  and oriented  Airway & Oxygen Therapy: Patient Spontanous Breathing and Patient connected to nasal cannula oxygen  Post-op Assessment: Report given to RN and Post -op Vital signs reviewed and stable  Post vital signs: Reviewed and stable  Last Vitals:  Vitals Value Taken Time  BP 137/83 03/21/2018  9:42 AM  Temp 36.2 C 03/21/2018  9:42 AM  Pulse 87 03/21/2018  9:47 AM  Resp 14 03/21/2018  9:47 AM  SpO2 99 % 03/21/2018  9:47 AM  Vitals shown include unvalidated device data.  Last Pain:  Vitals:   03/21/18 0945  TempSrc:   PainSc: 7       Patients Stated Pain Goal: 4 (03/21/18 0945)  Complications: No apparent anesthesia complications and Patient re-intubated

## 2018-03-21 NOTE — Op Note (Signed)
03/21/2018  9:19 AM  PATIENT:  Leslie Stein  27 y.o. female  PRE-OPERATIVE DIAGNOSIS:  INCISIONAL HERNIA  POST-OPERATIVE DIAGNOSIS:  INCISIONAL HERNIA approx 5cm x 6 cm  PROCEDURE:  Procedure(s): OPEN INCISIONAL HERNIA REPAIR WITH MESH, RETRORECTUS (N/A) INSERTION OF MESH (N/A)  SURGEON:  Surgeon(s) and Role:    * Axel Filleramirez, Jalexus Brett, MD - Primary  ASSISTANTS: Myrtie SomanSharon Hitchcock, RNFA   ANESTHESIA:   local, regional and general  EBL:  30 mL   BLOOD ADMINISTERED:none  DRAINS: none   LOCAL MEDICATIONS USED:  BUPIVICAINE  and OTHER Exparil  SPECIMEN:  No Specimen  DISPOSITION OF SPECIMEN:  N/A  COUNTS:  YES  TOURNIQUET:  * No tourniquets in log *  DICTATION: .Dragon Dictation After the patient was consented he was taken back to the operating room and placed in the supine position with bilateral SCDs in place. The patient was prepped and draped in usual sterile fashion. Antibiotics were confirmed and timeout was called and all facts verified.  At this time I proceeded to excise the skin scar. This was discarded. I proceeded to use electrocautery to maintain hemostasis and dissection took place in the most superior portion of the wound down to the subcutaneous tissues fat to the anterior fascia. This was incised. The fascia was elevated and 2 Kocher clamps.  The hernia sac and the abdominal cavity were entered bluntly. There appeared to be some omentum within the midline of the wound. This was carefully dissected away from the abdominal wall. The omentum lay in the midline, this was taken down with blunt and sharp dissection circumferentially to the incision.The hernia sac was dissected back to the rectus fascia.  The hernia was seen to be approximately 5 x 6 cm. There were minimal interior adhesions.  At this time I proceeded to retract is rectus muscles medially.  At this time the posterior fascia was then incised.  Using blunt dissection the belly was dissected away from the  posterior rectus fascia.  This was done inferiorly and superiorly.  At the midline inferior and superior portions of the linea alba this was taken down from the anterior abdominal wall.  This was done bilaterally.  The area was checked for hemostasis.  At this time the posterior rectus fascia was reapproximated using #1 PDS in a standard running fashion x2.  I proceeded to irrigate out the retrorectus space.  The area was measured and seemed to be approximately 15 x 10 cm in size.  A piece of Ethicon UltraPro mesh 15 x15cm was selected and cut to fit in place into the retrorectus space.  This fit well and flat.  #1 Novafils were used as transfascial sutures, 2 bilateral and 2 in the superior and inferior portions of the mesh.  These were placed via stab incisions on the skin and using an endoclose device.  At this time the anterior fascia and midline were reapproximated using #1 PDS in a standard running fashion x2.  The subcutaneous tissue was irrigated out with sterile saline and reapproximated using 3-0 vicryls.  The skin was then reapproximated using 3-0 Monocryl in subcuticular fashion and Dermabond was placed on the skin incision.  The patient tolerated procedure well was taken to the recovery room in stable condition.  PLAN OF CARE: Admit for overnight observation  PATIENT DISPOSITION:  PACU - hemodynamically stable.   Delay start of Pharmacological VTE agent (>24hrs) due to surgical blood loss or risk of bleeding: no

## 2018-03-21 NOTE — H&P (Signed)
  History of Present Illness The patient is a 27 year old female who presents with an incisional hernia. Chief Complaint: Incisional hernia  Patient is a 27 year old female who in March 2019 underwent an open strangling a ventral hernia repair as well as small bowel resection including Meckel's diverticulum for necrotic small bowel by Dr. Donell BeersByerly. Patient states over the last 1-2 months she's noticeable left side of her incision. She's had some stinging type pain that resolved fairly quickly. Patient underwent CT scan which I did review personally which did reveal incisional hernia. The patient the small bowel within the hernia however the hernia is wid[place]. Previously patient had subumbilical cyst removal as well as ovarian cyst removal laparoscopically but these were approximately 6-8 years ago.   Allergies  Percocet *ANALGESICS - OPIOID*  Allergies Reconciled   Medication History ValACYclovir HCl (500MG  Tablet, Oral) Active. Alyacen 1/35 (1-35MG -MCG Tablet, Oral) Active. Norethindrone Acet-Ethinyl Est (1-20MG -MCG Tablet, 1 Oral daily) Active. Medications Reconciled    Review of Systems  All other systems negative  BP (!) 187/106   Pulse 92   Temp 98.4 F (36.9 C) (Oral)   Resp 18   LMP 03/14/2018   SpO2 100%    Physical Exam  The physical exam findings are as follows: Note:Constitutional: No acute distress, conversant, appears stated age  Eyes: Anicteric sclerae, moist conjunctiva, no lid lag  Neck: No thyromegaly, trachea midline, no cervical lymphadenopathy  Lungs: Clear to auscultation biilaterally, normal respiratory effot  Cardiovascular: regular rate & rhythm, no murmurs, no peripheal edema, pedal pulses 2+  GI: Soft, no masses or hepatosplenomegaly, non-tender to palpation  MSK: Normal gait, no clubbing cyanosis, edema  Skin: No rashes, palpation reveals normal skin turgor  Psychiatric: Appropriate judgment and insight,  oriented to person, place, and time  Abdomen Inspection Hernias - Incisional - Reducible(Midline incisional hernia, approximately 2-3 cm of separation.).    Assessment & Plan  INCISIONAL HERNIA, WITHOUT OBSTRUCTION OR GANGRENE (K43.2) Impression: 27 year old female with an incisional hernia. 1. The patient will like to proceed to the operating room for open incisional hernia repair with mesh, retrorectus  2. I discussed with the patient the signs and symptoms of incarceration and strangulation and the need to proceed to the ER should they occur.  3. I discussed with the patient the risks and benefits of the procedure to include but not limited to: Infection, bleeding, damage to surrounding structures, possible need for further surgery, possible nerve pain, and possible recurrence. The patient was understanding and wishes to proceed.

## 2018-03-21 NOTE — Anesthesia Postprocedure Evaluation (Signed)
Anesthesia Post Note  Patient: Leslie Stein  Procedure(s) Performed: OPEN INCISIONAL HERNIA REPAIR WITH MESH, RETRORECTUS (N/A Abdomen) INSERTION OF MESH (N/A Abdomen)     Patient location during evaluation: PACU Anesthesia Type: General Level of consciousness: awake and alert Pain management: pain level controlled Vital Signs Assessment: post-procedure vital signs reviewed and stable Respiratory status: spontaneous breathing, nonlabored ventilation, respiratory function stable and patient connected to nasal cannula oxygen Cardiovascular status: blood pressure returned to baseline and stable Postop Assessment: no apparent nausea or vomiting Anesthetic complications: no    Last Vitals:  Vitals:   03/21/18 1028 03/21/18 1043  BP: 121/84 110/72  Pulse: 74 70  Resp: 15 14  Temp:    SpO2: 97% 96%    Last Pain:  Vitals:   03/21/18 1043  TempSrc:   PainSc: Asleep                 Wm Fruchter L [name]

## 2018-03-21 NOTE — Anesthesia Procedure Notes (Signed)
Procedure Name: Intubation Date/Time: 03/21/2018 7:53 AM Performed by: Julieta Bellini, CRNA Pre-anesthesia Checklist: Patient identified, Emergency Drugs available, Suction available and Patient being monitored Patient Re-evaluated:Patient Re-evaluated prior to induction Oxygen Delivery Method: Circle system utilized Preoxygenation: Pre-oxygenation with 100% oxygen Induction Type: IV induction Ventilation: Mask ventilation without difficulty Laryngoscope Size: Mac and 4 Grade View: Grade I Tube type: Oral Tube size: 7.0 mm Number of attempts: 1 Airway Equipment and Method: Stylet Placement Confirmation: ETT inserted through vocal cords under direct vision,  positive ETCO2 and breath sounds checked- equal and bilateral Secured at: 21 cm Tube secured with: Tape Dental Injury: Teeth and Oropharynx as per pre-operative assessment

## 2018-03-21 NOTE — Discharge Instructions (Signed)
[facility] _______[facility] ° °HERNIA REPAIR: POST OP INSTRUCTIONS ° °Always review your discharge instruction sheet given to you by the facility where your surgery was performed. °IF YOU HAVE DISABILITY OR FAMILY LEAVE FORMS, YOU MUST BRING THEM TO THE OFFICE FOR PROCESSING.   °DO NOT GIVE THEM TO YOUR DOCTOR. ° °1. A  prescription for pain medication may be given to you upon discharge.  Take your pain medication as prescribed, if needed.  If narcotic pain medicine is not needed, then you may take acetaminophen (Tylenol) or ibuprofen (Advil) as needed. °2. Take your usually prescribed medications unless otherwise directed. °If you need a refill on your pain medication, please contact your pharmacy.  They will contact our office to request authorization. Prescriptions will not be filled after 5 pm or on week-ends. °3. You should follow a light diet the first 24 hours after arrival home, such as soup and crackers, etc.  Be sure to include lots of fluids daily.  Resume your normal diet the day after surgery. °4.Most patients will experience some swelling and bruising around the umbilicus or in the groin and scrotum.  Ice packs and reclining will help.  Swelling and bruising can take several days to resolve.  °6. It is common to experience some constipation if taking pain medication after surgery.  Increasing fluid intake and taking a stool softener (such as Colace) will usually help or prevent this problem from occurring.  A mild laxative (Milk of Magnesia or Miralax) should be taken according to package directions if there are no bowel movements after 48 hours. °7. Unless discharge instructions indicate otherwise, you may remove your bandages 24-48 hours after surgery, and you may shower at that time.  You may have steri-strips (small skin tapes) in place directly over the incision.  These strips should be left on the skin for 7-10 days.  If your surgeon used skin glue on the incision, you may shower in  24 hours.  The glue will flake off over the next 2-3 weeks.  Any sutures or staples will be removed at the office during your follow-up visit. °8. ACTIVITIES:  You may resume regular (light) daily activities beginning the next day--such as daily self-care, walking, climbing stairs--gradually increasing activities as tolerated.  You may have sexual intercourse when it is comfortable.  Refrain from any heavy lifting or straining until approved by your doctor. ° °a.You may drive when you are no longer taking prescription pain medication, you can comfortably wear a seatbelt, and you can safely maneuver your car and apply brakes. °b.RETURN TO WORK:   °_____________________________________________ ° °9.You should see your doctor in the office for a follow-up appointment approximately 2-3 weeks after your surgery.  Make sure that you call for this appointment within a day or two after you arrive home to insure a convenient appointment time. °10.OTHER INSTRUCTIONS: _________________________ °   _____________________________________ ° °WHEN TO CALL YOUR DOCTOR: °1. Fever over 101.0 °2. Inability to urinate °3. Nausea and/or vomiting °4. Extreme swelling or bruising °5. Continued bleeding from incision. °6. Increased pain, redness, or drainage from the incision ° °The clinic staff is available to answer your questions during regular business hours.  Please don’t hesitate to call and ask to speak to one of the nurses for clinical concerns.  If you have a medical emergency, go to the nearest emergency room or call 911.  A surgeon from [facility] is always on call at the hospital ° ° °[address], [address], [place], [place]  [address] ? ° [address] [address], , [place]   [address] °[phone] ? [phone] ? FAX [phone] °Web site: [email or web address] ° °

## 2018-03-22 ENCOUNTER — Encounter (HOSPITAL_COMMUNITY): Payer: Self-pay | Admitting: General Surgery

## 2018-03-22 ENCOUNTER — Other Ambulatory Visit: Payer: Self-pay

## 2018-03-22 DIAGNOSIS — K432 Incisional hernia without obstruction or gangrene: Secondary | ICD-10-CM | POA: Diagnosis not present

## 2018-03-22 NOTE — Discharge Summary (Signed)
Physician Discharge Summary  Patient ID: Leslie Stein MRN: [id number] DOB/AGE: 29/08/1990 29 y.o.  Admit date: 03/21/2018 Discharge date: 03/22/2018  Admission Diagnoses: S/p hernia repair  Discharge Diagnoses:  Active Problems:   S/P hernia repair   Discharged Condition: good  Hospital Course: Pt underwent surgery as per above.  Post op she was sent to the floor.  She was started on a CLD and adv to a reg diet.  She had good pain control.  She was ambulating well on her own.  She was otherwise afebrile, deemed stable for DC and Dc'd home  Consults: None  Significant Diagnostic Studies: none  Treatments: surgery: as above  Discharge Exam: Blood pressure (!) 150/93, pulse 96, temperature 98 F (36.7 C), temperature source Oral, resp. rate 18, last menstrual period 03/14/2018, SpO2 99 %. General appearance: alert and cooperative GI: soft, non-tender; bowel sounds normal; no masses,  no organomegaly and inc c/d/i  Disposition: Discharge disposition: 01-Home or Self Care       Discharge Instructions    Diet - low sodium heart healthy   Complete by:  As directed    Increase activity slowly   Complete by:  As directed      Allergies as of 03/22/2018      Reactions   Percocet [oxycodone-acetaminophen] Nausea And Vomiting   Tolerates acetaminophen      Medication List    TAKE these medications   [name] 1/35 tablet Generic drug:  norethindrone-ethinyl estradiol 1/35 Take 1 tablet by mouth daily.   fluticasone 50 MCG/ACT nasal spray Commonly known as:  FLONASE Place 2 sprays into both nostrils daily.   loratadine 10 MG tablet Commonly known as:  CLARITIN Take 10 mg by mouth daily.   loratadine-pseudoephedrine 10-240 MG 24 hr tablet Commonly known as:  CLARITIN-D 24-hour Take 1 tablet by mouth daily as needed for allergies.   multivitamin with minerals Tabs tablet Take 1 tablet by mouth daily.   naproxen sodium 220 MG tablet Commonly known as:   ALEVE Take 440 mg by mouth daily as needed (headache or pain).   traMADol 50 MG tablet Commonly known as:  ULTRAM Take 1 tablet (50 mg total) by mouth every 6 (six) hours as needed.   valACYclovir 500 MG tablet Commonly known as:  VALTREX Take 500 mg by mouth daily.      Follow-up Information    Axel Filleramirez, Ikran Patman, MD In 2 weeks.   Specialty:  General Surgery Why:  For wound re-check Contact information: [address] [place] [address] [phone]           Signed: Axel Fillerrmando Jake Goodson 03/22/2018, 1:22 PM

## 2018-03-22 NOTE — Progress Notes (Signed)
Discharged home today accompanied by patient's mother. Personal belongings,discharged instructions, prescription, given to patient. Verbalized understanding of instructions.

## 2018-10-16 IMAGING — CT CT ABD-PELV W/ CM
2 of 4 series · 15 of 46 positions shown, 17 images · IV contrast (ISOVUE)
Comparison: None.

ADDENDUM:
After further review of the images with Dr. Laufer, there is
malrotation of the bowel. As such, a majority of the small bowel is
located to the right of midline and a majority of the colon is in
the left abdomen.
CLINICAL DATA: Periumbilical abdominal pain. History of umbilical
cyst removal 3 years ago.

EXAM:
CT ABDOMEN AND PELVIS WITH CONTRAST
TECHNIQUE: Multidetector CT imaging of the abdomen and pelvis was performed
using the standard protocol following bolus administration of
intravenous contrast.
CONTRAST:  100mL RXUPPQ-155 IOPAMIDOL (RXUPPQ-155) INJECTION 61%

[Series 2: axial st · axial · 0.76mm/px · z∈[+961,+1381]mm · 12 of 96 slices shown, 14 images]
[im 6/96  soft-tissue]
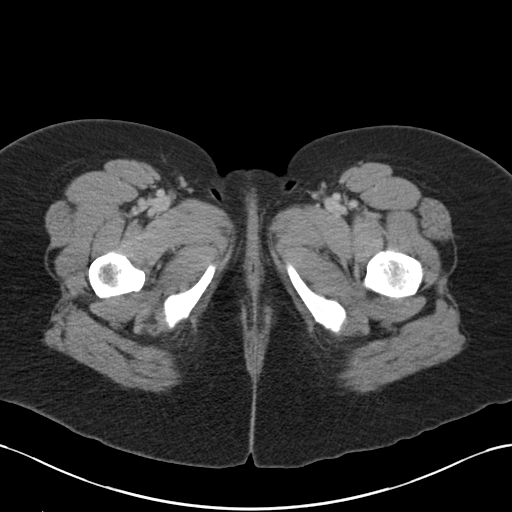
[im 6/96  bone]
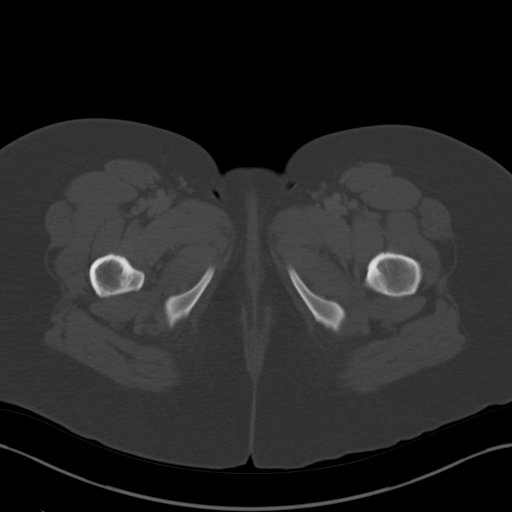
[im 16/96  soft-tissue]
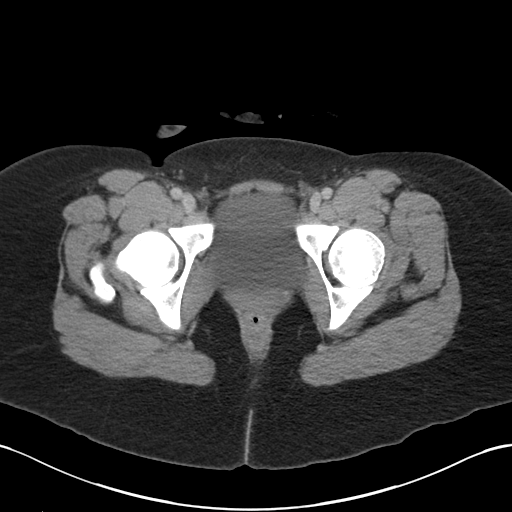
[im 22/96  soft-tissue]
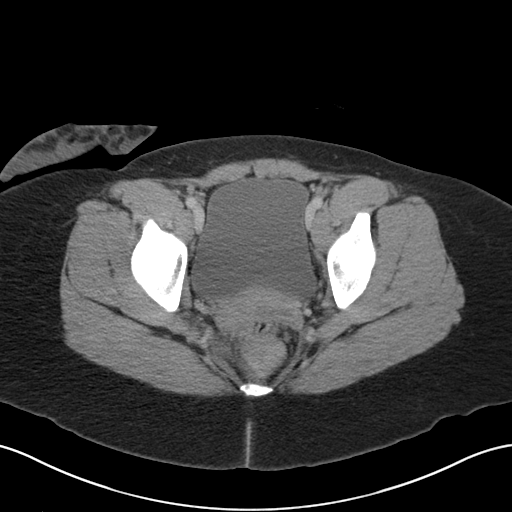
[im 27/96  soft-tissue]
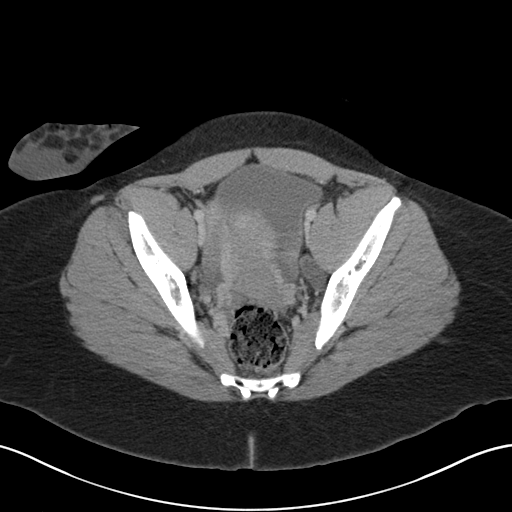
[im 37/96  soft-tissue]
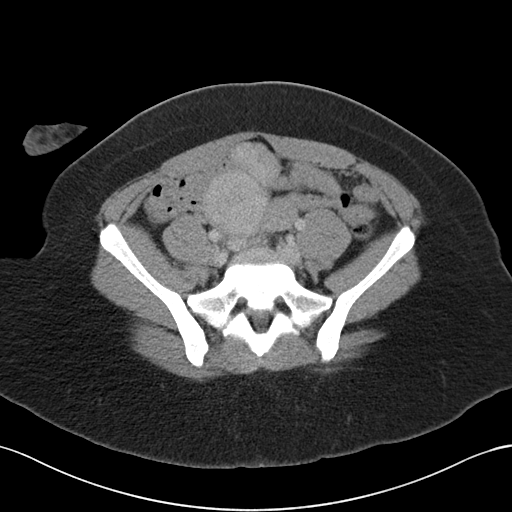
[im 43/96  soft-tissue]
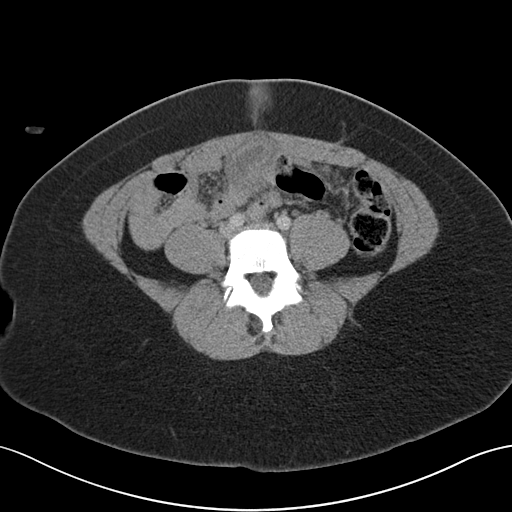
[im 53/96  soft-tissue]
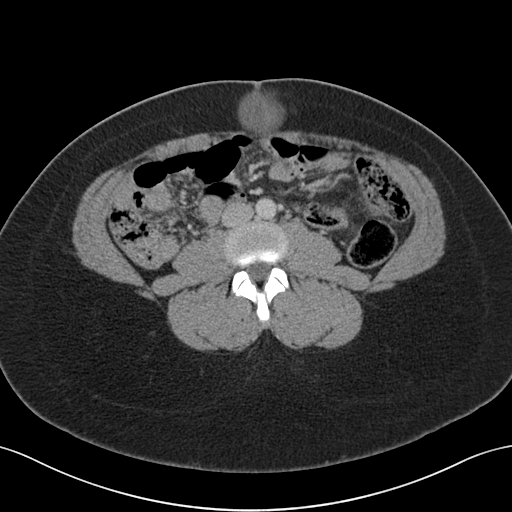
[im 59/96  soft-tissue]
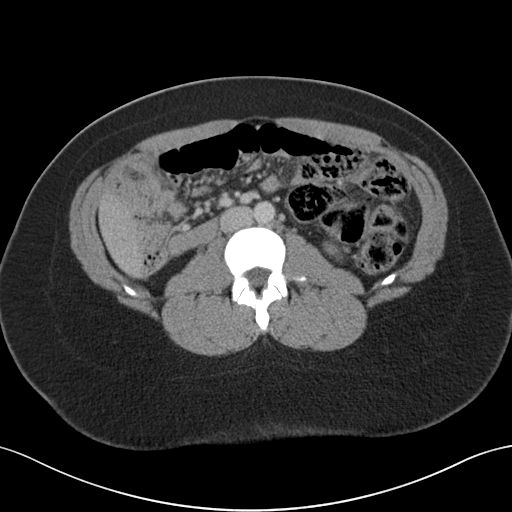
[im 69/96  soft-tissue]
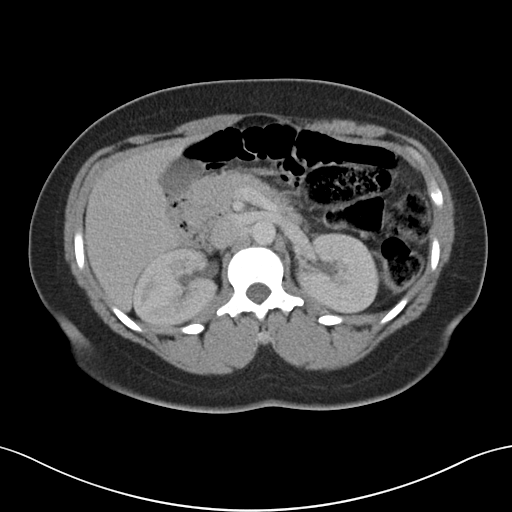
[im 69/96  bone]
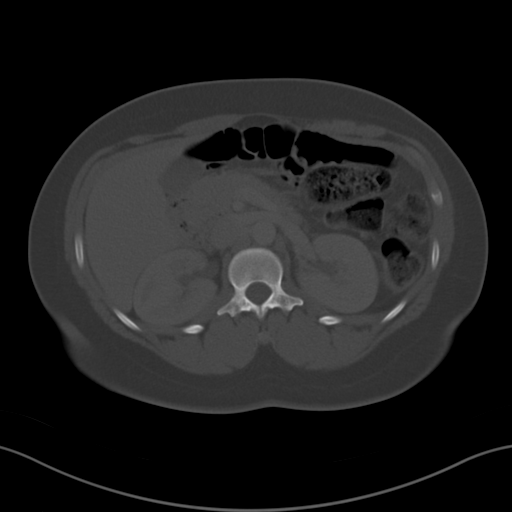
[im 74/96  soft-tissue]
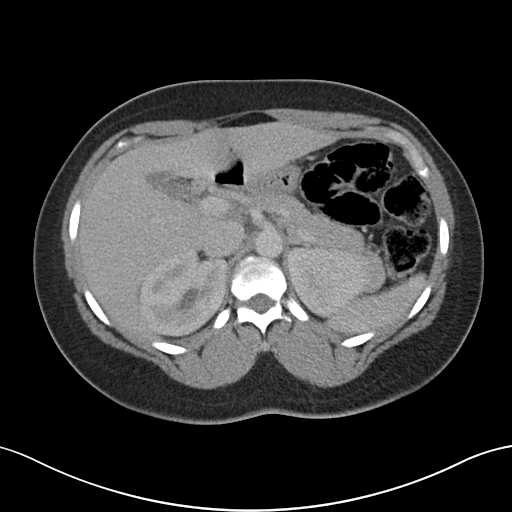
[im 80/96  soft-tissue]
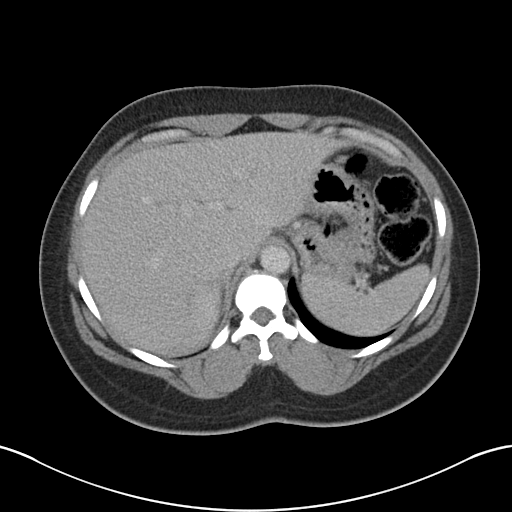
[im 90/96  soft-tissue]
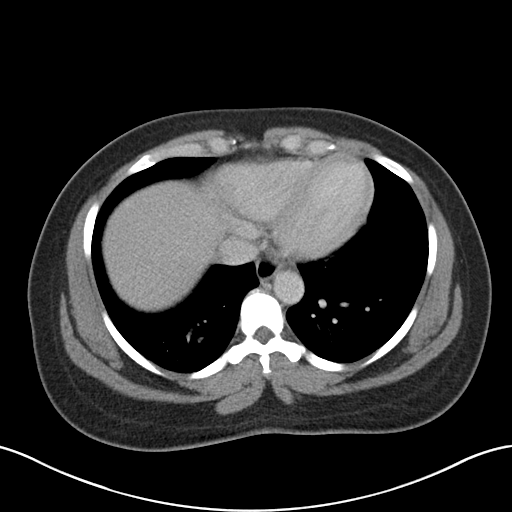

[Series 5: coronal st · coronal · 0.86mm/px · 3 of 110 slices shown]
[im 37/110  soft-tissue]
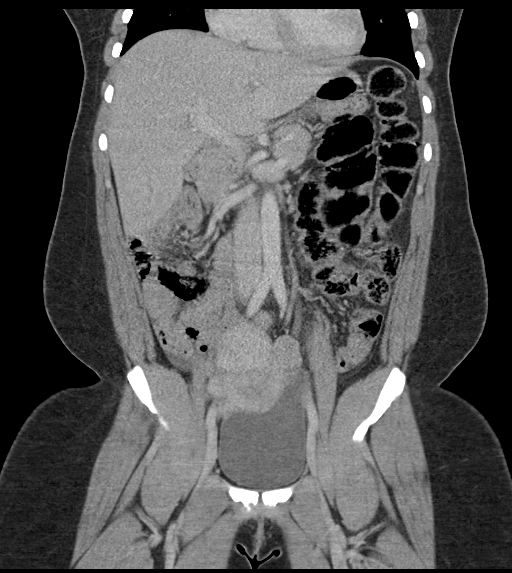
[im 49/110  soft-tissue]
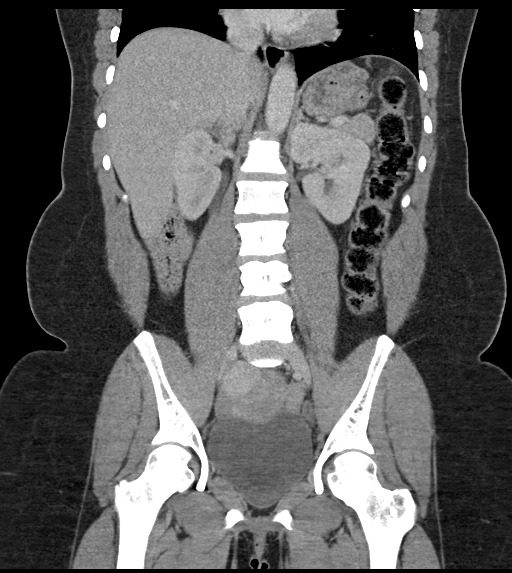
[im 61/110  soft-tissue]
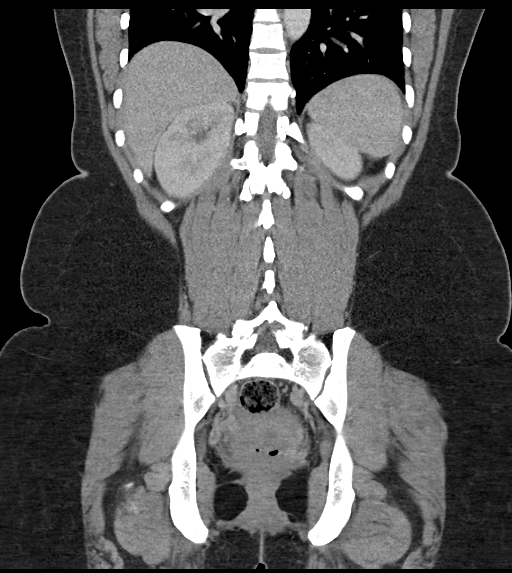

[15 of 46 positions shown; findings below may reference images not displayed]

FINDINGS: Lower chest: No acute abnormality.

Hepatobiliary: Ill-defined, somewhat curvilinear, hypodense focus is
seen within the inferior right hepatic lobe, of uncertain etiology
or significance, possibly focal intrahepatic bile duct ectasia.
Liver otherwise unremarkable. Gallbladder appears normal.

Pancreas: Unremarkable. No pancreatic ductal dilatation or
surrounding inflammatory changes.

Spleen: Normal in size without focal abnormality.

Adrenals/Urinary Tract: Adrenal glands are unremarkable. Kidneys are
normal, without renal calculi, focal lesion, or hydronephrosis.
Bladder is unremarkable.

Stomach/Bowel: Periumbilical abdominal wall hernia containing a
small bowel loop. There is tethering of the mesenteric vessels at
the umbilical hernia site. The small bowel immediately deep to the
umbilical hernia site is mildly distended and has markedly thickened
walls and there is surrounding mesenteric inflammation, highly
suspicious for ischemic bowel, possibly associated incarceration.
The more proximal small bowel is not distended. Large bowel is
normal in caliber and configuration.

Vascular/Lymphatic: No significant vascular findings are present. No
enlarged abdominal or pelvic lymph nodes.

Reproductive: Multiple uterine fibroids, both myometrial and
exophytic, largest measuring 4-5 cm. No mass or fluid within either
adnexal region.

Other: No abscess collection.  No free intraperitoneal air.

Musculoskeletal: No acute or significant osseous findings.
IMPRESSION: 1. Umbilical abdominal wall hernia which contains a small bowel
loop. The small bowel immediately deep to the umbilical hernia site
is mildly distended and has markedly thickened walls with
surrounding mesenteric inflammation, highly suspicious for ischemic
bowel. There appears to be tethering of the adjacent mesenteric
vessels and there may be some component of incarceration and/or
closed-loop obstruction at the hernia site. Immediate surgical
consultation recommended.
2. Small amount of free fluid in the right lower quadrant.
3. Subtle cover linear hypodense focus within the inferior right
hepatic lobe, of uncertain etiology or significance. Differential
considerations include focal intrahepatic bile duct
dilatation/ectasia and hemangioma. Recommend nonemergent follow-up
with liver MRI.
4. Leiomyomatous uterus.

These results (in regards to the bowel) were called by telephone at
the time of interpretation on 07/09/2017 at [DATE] to Dr. MAUKKA
POLIN , who verbally acknowledged these results.

## 2019-05-14 IMAGING — CT CT ABD-PELV W/O CM
1 of 2 series · 14 of 32 positions shown, 19 images · non-contrast
Comparison: 07/09/2017

CLINICAL DATA: Question ventral hernia recurrence. Bulging. Prior
hernia repair.

EXAM:
CT ABDOMEN AND PELVIS WITHOUT CONTRAST
TECHNIQUE: Multidetector CT imaging of the abdomen and pelvis was performed
following the standard protocol without IV contrast.

[Series 3: abd/pelvis w/(date) · axial · 0.84mm/px · z∈[-507,-87]mm · 14 of 94 slices shown, 19 images]
[im 5/94  soft-tissue]
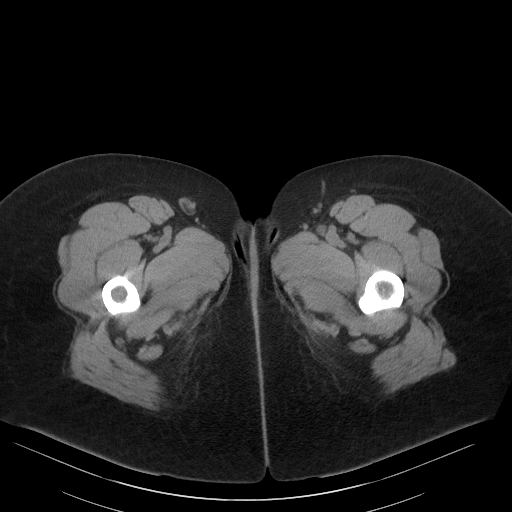
[im 5/94  bone]
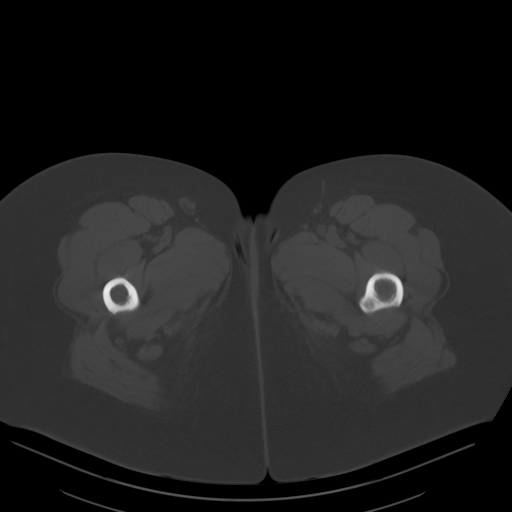
[im 15/94  soft-tissue]
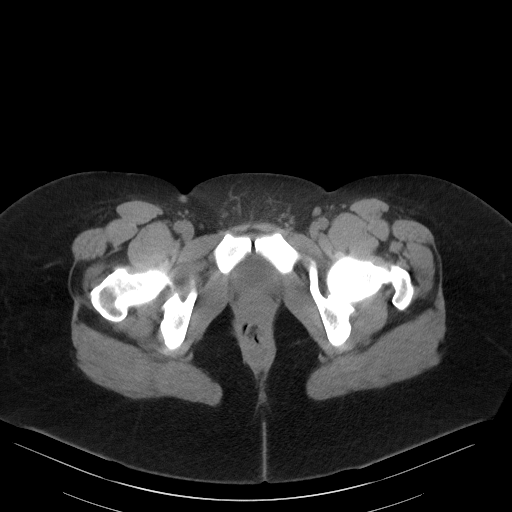
[im 20/94  soft-tissue]
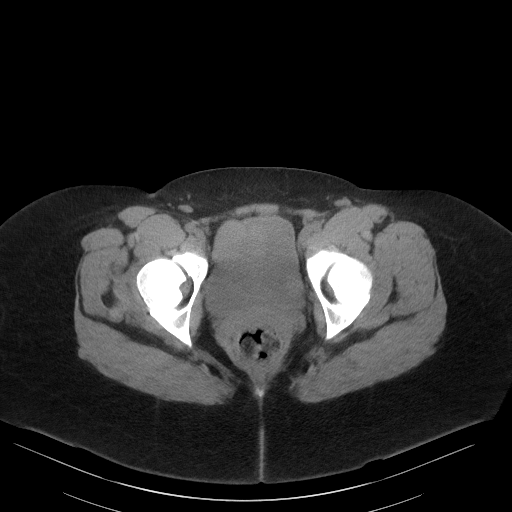
[im 25/94  soft-tissue]
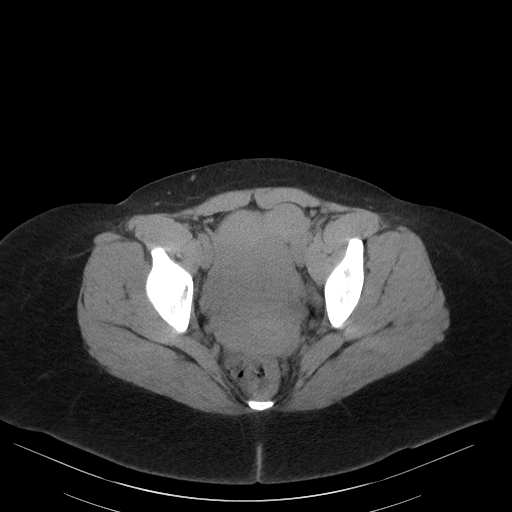
[im 35/94  soft-tissue]
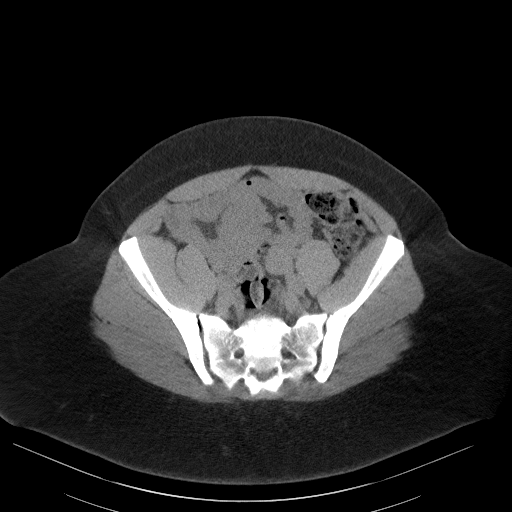
[im 40/94  soft-tissue]
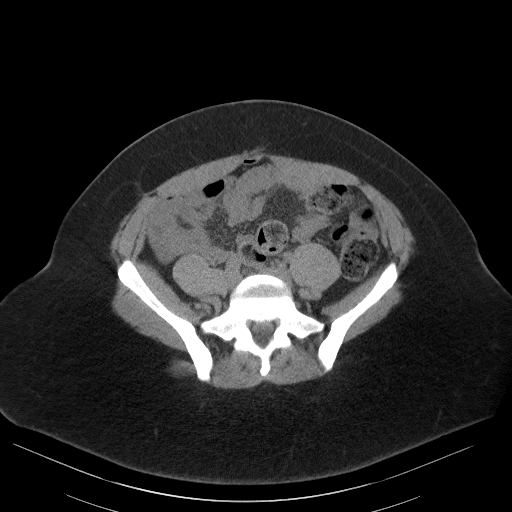
[im 49/94  soft-tissue]
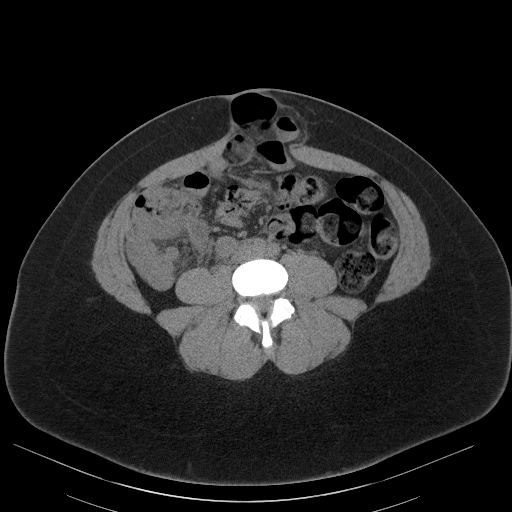
[im 54/94  soft-tissue]
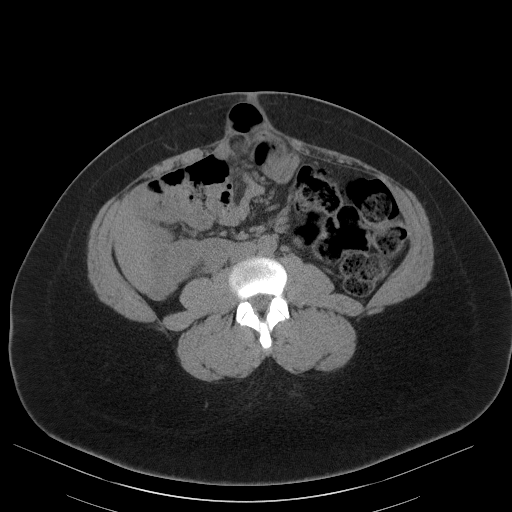
[im 59/94  soft-tissue]
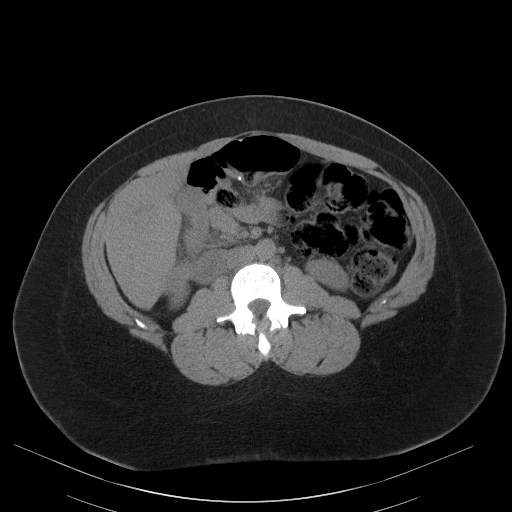
[im 59/94  bone]
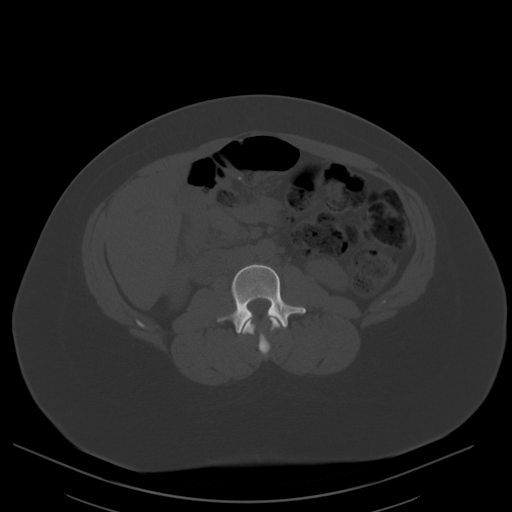
[im 69/94  soft-tissue]
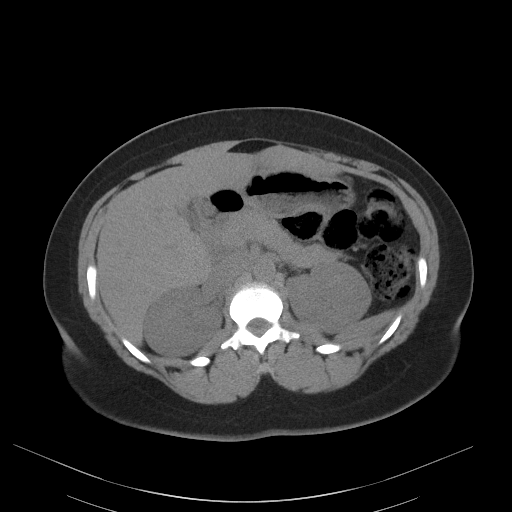
[im 74/94  soft-tissue]
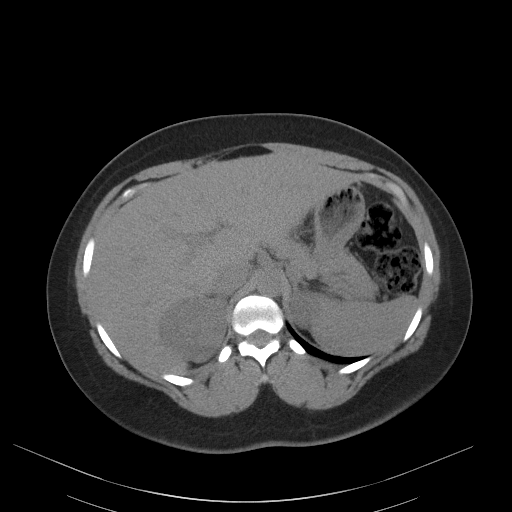
[im 74/94  lung]
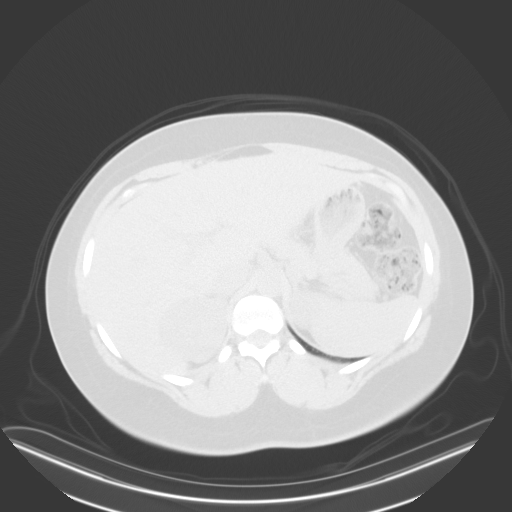
[im 79/94  soft-tissue]
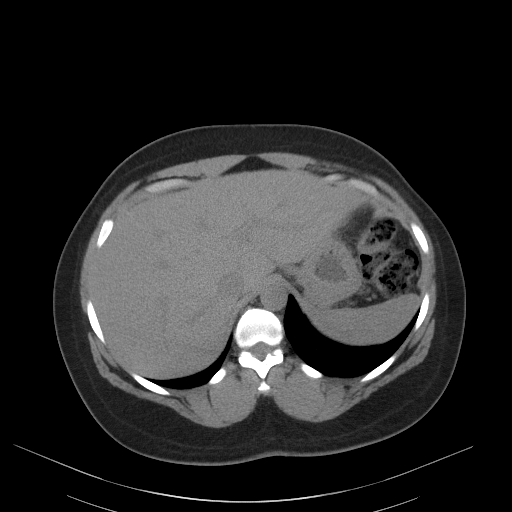
[im 79/94  lung]
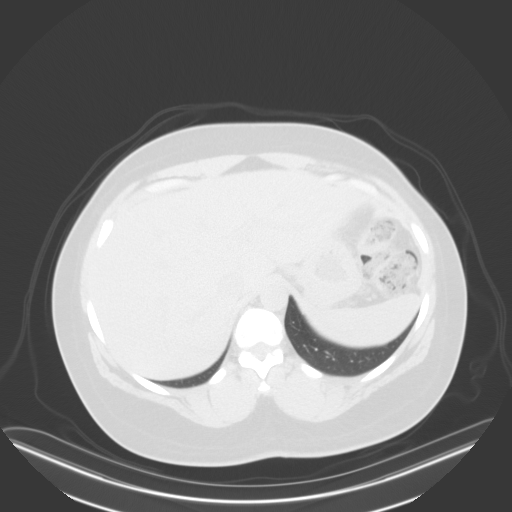
[im 84/94  lung]
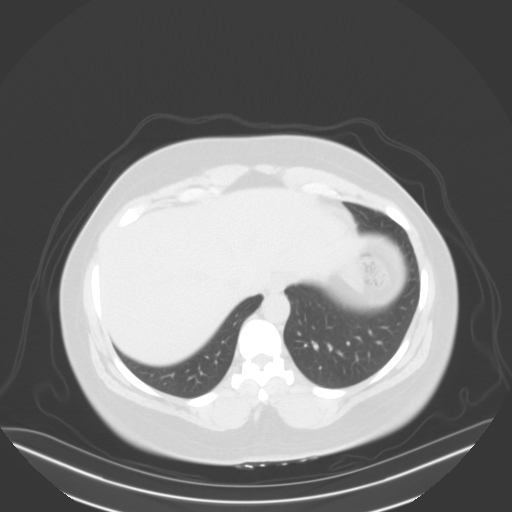
[im 89/94  soft-tissue]
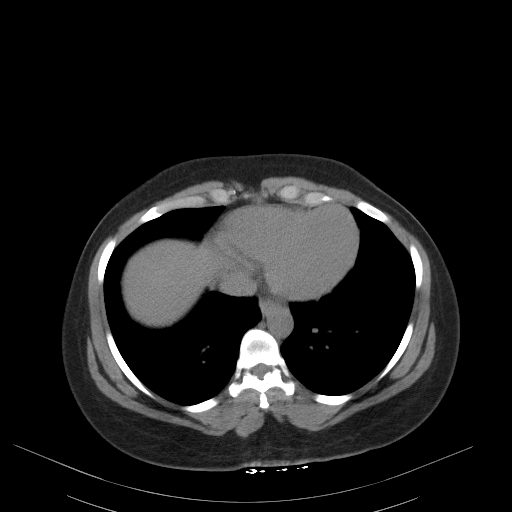
[im 89/94  lung]
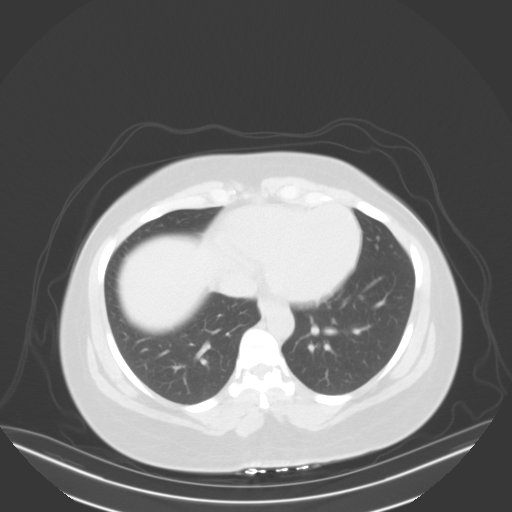

[14 of 32 positions shown; findings below may reference images not displayed]

FINDINGS: Lower chest: Lung bases are clear. No effusions. Heart is normal
size.

Hepatobiliary: Gallbladder is contracted. No focal hepatic
abnormality.

Pancreas: No focal abnormality or ductal dilatation.

Spleen: No focal abnormality.  Normal size.

Adrenals/Urinary Tract: No adrenal abnormality. No focal renal
abnormality. No stones or hydronephrosis. Urinary bladder is
unremarkable.

Stomach/Bowel: Moderate stool burden in the colon. Recurrent ventral
hernia containing several bowel loops. No evidence of bowel
obstruction.

Vascular/Lymphatic: No evidence of aneurysm or adenopathy.

Reproductive: Enlarged uterus with numerous uterine fibroids.

Other: No free fluid or free air.

Musculoskeletal: No acute bony abnormality.
IMPRESSION: Recurrent ventral hernia at the umbilicus and possibly
supraumbilical containing multiple bowel loops. No evidence of bowel
wall thickening or bowel obstruction.

Uterine fibroids
